# Patient Record
Sex: Female | Born: 1970 | Race: White | Hispanic: No | Marital: Married | Smoking: Former smoker
Health system: Southern US, Community
[De-identification: ages and names within clinical notes are randomized; demographics above are authoritative.]

## PROBLEM LIST (undated history)

## (undated) DIAGNOSIS — F419 Anxiety disorder, unspecified: Secondary | ICD-10-CM

## (undated) DIAGNOSIS — M1990 Unspecified osteoarthritis, unspecified site: Secondary | ICD-10-CM

## (undated) DIAGNOSIS — M419 Scoliosis, unspecified: Secondary | ICD-10-CM

## (undated) DIAGNOSIS — G43909 Migraine, unspecified, not intractable, without status migrainosus: Secondary | ICD-10-CM

## (undated) DIAGNOSIS — R000 Tachycardia, unspecified: Secondary | ICD-10-CM

## (undated) DIAGNOSIS — G8929 Other chronic pain: Secondary | ICD-10-CM

## (undated) DIAGNOSIS — K219 Gastro-esophageal reflux disease without esophagitis: Secondary | ICD-10-CM

## (undated) DIAGNOSIS — M549 Dorsalgia, unspecified: Secondary | ICD-10-CM

## (undated) DIAGNOSIS — R002 Palpitations: Secondary | ICD-10-CM

## (undated) HISTORY — PX: CHOLECYSTECTOMY: SHX55

## (undated) HISTORY — PX: BACK SURGERY: SHX140

---

## 2007-07-28 ENCOUNTER — Ambulatory Visit: Payer: Self-pay | Admitting: Internal Medicine

## 2008-08-04 ENCOUNTER — Inpatient Hospital Stay: Payer: Self-pay | Admitting: Psychiatry

## 2008-10-30 ENCOUNTER — Ambulatory Visit: Payer: Self-pay

## 2009-03-05 ENCOUNTER — Ambulatory Visit: Payer: Self-pay | Admitting: Internal Medicine

## 2009-03-16 ENCOUNTER — Ambulatory Visit: Payer: Self-pay | Admitting: Internal Medicine

## 2010-02-28 ENCOUNTER — Ambulatory Visit: Payer: Self-pay | Admitting: Internal Medicine

## 2010-11-11 ENCOUNTER — Ambulatory Visit: Payer: Self-pay | Admitting: Internal Medicine

## 2013-01-01 ENCOUNTER — Emergency Department: Payer: Self-pay | Admitting: Emergency Medicine

## 2013-01-01 LAB — URINALYSIS, COMPLETE
Bilirubin,UR: NEGATIVE
Nitrite: NEGATIVE
Ph: 7 (ref 4.5–8.0)
Protein: NEGATIVE
RBC,UR: 16 /HPF (ref 0–5)
Specific Gravity: 1.024 (ref 1.003–1.030)

## 2013-01-01 LAB — CBC
HGB: 12.6 g/dL (ref 12.0–16.0)
MCHC: 34.8 g/dL (ref 32.0–36.0)
Platelet: 252 10*3/uL (ref 150–440)
RDW: 13.3 % (ref 11.5–14.5)
WBC: 10.4 10*3/uL (ref 3.6–11.0)

## 2013-01-02 LAB — COMPREHENSIVE METABOLIC PANEL
Albumin: 3.2 g/dL — ABNORMAL LOW (ref 3.4–5.0)
Alkaline Phosphatase: 70 U/L (ref 50–136)
Anion Gap: 5 — ABNORMAL LOW (ref 7–16)
Bilirubin,Total: 0.3 mg/dL (ref 0.2–1.0)
Calcium, Total: 8.4 mg/dL — ABNORMAL LOW (ref 8.5–10.1)
Chloride: 108 mmol/L — ABNORMAL HIGH (ref 98–107)
EGFR (African American): >60
EGFR (Non-African Amer.): >60
Osmolality: 278 (ref 275–301)
Potassium: 3.6 mmol/L (ref 3.5–5.1)
SGOT(AST): 18 U/L (ref 15–37)
Sodium: 139 mmol/L (ref 136–145)
Total Protein: 6.9 g/dL (ref 6.4–8.2)

## 2013-02-10 ENCOUNTER — Ambulatory Visit: Payer: Self-pay | Admitting: Unknown Physician Specialty

## 2013-02-18 ENCOUNTER — Ambulatory Visit: Payer: Self-pay | Admitting: Surgery

## 2013-03-02 LAB — PATHOLOGY REPORT

## 2013-05-18 ENCOUNTER — Ambulatory Visit: Payer: Self-pay

## 2013-07-28 ENCOUNTER — Emergency Department (HOSPITAL_COMMUNITY)
Admission: EM | Admit: 2013-07-28 | Discharge: 2013-07-28 | Disposition: A | Discharge status: Home / Self Care | Payer: No Typology Code available for payment source | Attending: Emergency Medicine | Admitting: Emergency Medicine

## 2013-07-28 ENCOUNTER — Encounter (HOSPITAL_COMMUNITY): Payer: Self-pay | Admitting: Emergency Medicine

## 2013-07-28 DIAGNOSIS — Z3202 Encounter for pregnancy test, result negative: Secondary | ICD-10-CM | POA: Insufficient documentation

## 2013-07-28 DIAGNOSIS — R1013 Epigastric pain: Secondary | ICD-10-CM | POA: Insufficient documentation

## 2013-07-28 DIAGNOSIS — Z79899 Other long term (current) drug therapy: Secondary | ICD-10-CM | POA: Insufficient documentation

## 2013-07-28 DIAGNOSIS — Z87891 Personal history of nicotine dependence: Secondary | ICD-10-CM | POA: Insufficient documentation

## 2013-07-28 DIAGNOSIS — R110 Nausea: Secondary | ICD-10-CM | POA: Insufficient documentation

## 2013-07-28 DIAGNOSIS — Z9089 Acquired absence of other organs: Secondary | ICD-10-CM | POA: Insufficient documentation

## 2013-07-28 LAB — CBC WITH DIFFERENTIAL/PLATELET
BASOS ABS: 0 10*3/uL (ref 0.0–0.1)
Basophils Relative: 0 % (ref 0–1)
Eosinophils Absolute: 0.1 10*3/uL (ref 0.0–0.7)
Eosinophils Relative: 1 % (ref 0–5)
HEMATOCRIT: 40.2 % (ref 36.0–46.0)
HEMOGLOBIN: 13.6 g/dL (ref 12.0–15.0)
LYMPHS PCT: 22 % (ref 12–46)
Lymphs Abs: 2.1 10*3/uL (ref 0.7–4.0)
MCH: 29.2 pg (ref 26.0–34.0)
MCHC: 33.8 g/dL (ref 30.0–36.0)
MCV: 86.5 fL (ref 78.0–100.0)
MONO ABS: 0.8 10*3/uL (ref 0.1–1.0)
MONOS PCT: 8 % (ref 3–12)
NEUTROS ABS: 6.6 10*3/uL (ref 1.7–7.7)
NEUTROS PCT: 69 % (ref 43–77)
Platelets: 246 10*3/uL (ref 150–400)
RBC: 4.65 MIL/uL (ref 3.87–5.11)
RDW: 13 % (ref 11.5–15.5)
WBC: 9.6 10*3/uL (ref 4.0–10.5)

## 2013-07-28 LAB — URINALYSIS, ROUTINE W REFLEX MICROSCOPIC
Bilirubin Urine: NEGATIVE
GLUCOSE, UA: NEGATIVE mg/dL
KETONES UR: 15 mg/dL — AB
Nitrite: NEGATIVE
PROTEIN: NEGATIVE mg/dL
Specific Gravity, Urine: 1.026 (ref 1.005–1.030)
UROBILINOGEN UA: 0.2 mg/dL (ref 0.0–1.0)
pH: 5.5 (ref 5.0–8.0)

## 2013-07-28 LAB — POCT PREGNANCY, URINE: PREG TEST UR: NEGATIVE

## 2013-07-28 LAB — POCT I-STAT, CHEM 8
BUN: 15 mg/dL (ref 6–23)
Calcium, Ion: 1.25 mmol/L — ABNORMAL HIGH (ref 1.12–1.23)
Chloride: 104 mEq/L (ref 96–112)
Creatinine, Ser: 0.8 mg/dL (ref 0.50–1.10)
GLUCOSE: 83 mg/dL (ref 70–99)
HCT: 41 % (ref 36.0–46.0)
HEMOGLOBIN: 13.9 g/dL (ref 12.0–15.0)
Potassium: 3.9 mEq/L (ref 3.7–5.3)
Sodium: 142 mEq/L (ref 137–147)
TCO2: 25 mmol/L (ref 0–100)

## 2013-07-28 LAB — COMPREHENSIVE METABOLIC PANEL
ALK PHOS: 56 U/L (ref 39–117)
ALT: 18 U/L (ref 0–35)
AST: 17 U/L (ref 0–37)
Albumin: 3.4 g/dL — ABNORMAL LOW (ref 3.5–5.2)
BILIRUBIN TOTAL: 0.4 mg/dL (ref 0.3–1.2)
BUN: 15 mg/dL (ref 6–23)
CHLORIDE: 105 meq/L (ref 96–112)
CO2: 26 meq/L (ref 19–32)
CREATININE: 0.71 mg/dL (ref 0.50–1.10)
Calcium: 9.1 mg/dL (ref 8.4–10.5)
GFR calc Af Amer: >90 mL/min (ref >90)
Glucose, Bld: 86 mg/dL (ref 70–99)
POTASSIUM: 4.4 meq/L (ref 3.7–5.3)
Sodium: 142 mEq/L (ref 137–147)
Total Protein: 7.8 g/dL (ref 6.0–8.3)

## 2013-07-28 LAB — URINE MICROSCOPIC-ADD ON

## 2013-07-28 LAB — LIPASE, BLOOD: Lipase: 41 U/L (ref 11–59)

## 2013-07-28 MED ORDER — ESOMEPRAZOLE MAGNESIUM 40 MG PO CPDR: 40.0000 mg | DELAYED_RELEASE_CAPSULE | Freq: Every day | ORAL | Status: DC

## 2013-07-28 MED ORDER — SUCRALFATE 1 GM/10ML PO SUSP
1.0000 g | Freq: Three times a day (TID) | ORAL | Status: DC
Start: 2013-07-28 — End: 2023-12-01

## 2013-07-28 MED ORDER — GI COCKTAIL ~~LOC~~
30.0000 mL | Freq: Once | ORAL | Status: AC
  Administered 2013-07-28: 30 mL via ORAL
  Filled 2013-07-28: qty 30

## 2013-07-28 MED ORDER — OXYCODONE-ACETAMINOPHEN 5-325 MG PO TABS
1.0000 | ORAL_TABLET | Freq: Once | ORAL | Status: AC
Start: 2013-07-28 — End: 2013-07-28
  Administered 2013-07-28: 1 via ORAL
  Filled 2013-07-28 (×2): qty 1

## 2013-07-28 NOTE — ED Notes (Signed)
Pt c/o mid upper burning sharp abdominal pain onset Saturday. Pt reports pain causes nausea. Pt had Gallbladder removed in August and was suppose to do a follow up ultrasound, but she felt better.

## 2013-07-28 NOTE — Discharge Instructions (Signed)
Return to the ED with any concerns including vomiting, chest pain, difficulty breathing, worsening pain, decreased level of alertness/lethargy, or any other alarming symptoms

## 2013-07-28 NOTE — ED Notes (Signed)
Percocet dropped on floor by RN.  Percocet tablet wasted and witnessed by Gren[place]Brittany, Charity fundraiserN.  Second tablet removed from pyxis.

## 2013-07-28 NOTE — ED Provider Notes (Signed)
CSN: [id number]     Arrival date & time 07/28/13  1326 History   First MD Initiated Contact with Patient 07/28/13 1348     Chief Complaint  Patient presents with  . Abdominal Pain  . Nausea   (Consider location/radiation/quality/duration/timing/severity/associated sxs/prior Treatment) HPI Pt presents with c/o burning pain in epigastric region.  She states symptoms have been constant for the past several days.  She states she has hx of heartburn but usually feels it more in her chest.  Today she describes a burning sensation in her epigastrium.  No vomiting.  But with increased pain has had some nausea.  She has had cholecystectomy in august 2014, had some pain afterwards but this resolved- pain today is different.  No fever, no diarrhea.  Cannot relate pain to lying flat, certain foods.  Not made worse or better by eating or drinking.  Has not had alcohol in over 2 weeks.  No fever/chills.  No sob, no chest pain.  There are no other associated systemic symptoms, there are no other alleviating or modifying factors.   History reviewed. No pertinent past medical history. Past Surgical History  Procedure Laterality Date  . Cholecystectomy     No family history on file. History  Substance Use Topics  . Smoking status: Former Games developermoker  . Smokeless tobacco: Not on file  . Alcohol Use: Yes     Comment: rare   OB History   Grav Para Term Preterm Abortions TAB SAB Ect Mult Living                 Review of Systems ROS reviewed and all otherwise negative except for mentioned in HPI  Allergies  Review of patient's allergies indicates no known allergies.  Home Medications   Current Outpatient Rx  Name  Route  Sig  Dispense  Refill  . Ca Carbonate-Mag Hydroxide (ROLAIDS PO)   Oral   Take 1 tablet by mouth 3 (three) times daily as needed (heartburn).         . clonazePAM (KLONOPIN) 0.5 MG tablet   Oral   Take 0.5 mg by mouth 3 (three) times daily as needed for anxiety.         .  Norgestimate-Ethinyl Estradiol Triphasic (TRI-SPRINTEC) 0.18/0.215/0.25 MG-35 MCG tablet   Oral   Take 1 tablet by mouth daily.         . ranitidine (ZANTAC) 150 MG tablet   Oral   Take 150 mg by mouth 2 (two) times daily as needed for heartburn.         . esomeprazole (NEXIUM) 40 MG capsule   Oral   Take 1 capsule (40 mg total) by mouth daily.   30 capsule   0   . sucralfate (CARAFATE) 1 GM/10ML suspension   Oral   Take 10 mLs (1 g total) by mouth 4 (four) times daily -  with meals and at bedtime.   420 mL   0    BP 127/75  Pulse 77  Temp(Src) 98.5 F (36.9 C) (Oral)  Resp 18  Wt 128 lb 11.2 oz (58.378 kg)  SpO2 99%  LMP 07/07/2013 Vitals reviewed Physical Exam Physical Examination: General appearance - alert, well appearing, and in no distress Mental status - alert, oriented to person, place, and time Eyes - no scleral icterus, no conjunctival injection Mouth - mucous membranes moist, pharynx normal without lesions Chest - clear to auscultation, no wheezes, rales or rhonchi, symmetric air entry Heart - normal rate, regular  rhythm, normal S1, S2, no murmurs, rubs, clicks or gallops Abdomen - soft, mild ttp in epigastric region, no gaurding or rebound, nondistended, no masses or organomegaly Extremities - peripheral pulses normal, no pedal edema, no clubbing or cyanosis Skin - normal coloration and turgor, no rashes  ED Course  Procedures (including critical care time) Labs Review Labs Reviewed  COMPREHENSIVE METABOLIC PANEL - Abnormal; Notable for the following:    Albumin 3.4 (*)    All other components within normal limits  URINALYSIS, ROUTINE W REFLEX MICROSCOPIC - Abnormal; Notable for the following:    Color, Urine AMBER (*)    APPearance CLOUDY (*)    Hgb urine dipstick LARGE (*)    Ketones, ur 15 (*)    Leukocytes, UA MODERATE (*)    All other components within normal limits  URINE MICROSCOPIC-ADD ON - Abnormal; Notable for the following:     Squamous Epithelial / LPF FEW (*)    Bacteria, UA FEW (*)    All other components within normal limits  POCT I-STAT, CHEM 8 - Abnormal; Notable for the following:    Calcium, Ion 1.25 (*)    All other components within normal limits  URINE CULTURE  CBC WITH DIFFERENTIAL  LIPASE, BLOOD  POCT PREGNANCY, URINE   Imaging Review No results found.  EKG Interpretation    Date/Time:  Thursday July 28 2013 16:51:57 EST Ventricular Rate:  86 PR Interval:  143 QRS Duration: 99 QT Interval:  373 QTC Calculation: 446 R Axis:   56 Text Interpretation:  Sinus rhythm RSR' in V1 or V2, right VCD or RVH No old tracing to compare Confirmed by [facility]  MD, Delmer Kowalski [phone]) on 07/28/2013 7:02:14 PM Also confirmed by [facility] Ganja  MD, Morna Flud [phone]), editor Dan Humphreys, SANDRA [phone])  on 07/29/2013 7:30:19 AM            MDM   1. Epigastric pain    Epigastric pain, labs reassuring.  EKG reassuring as well.  Doubt ACS.  No findings c/w retained stone, pancreatitis, doubt SBO.  Pt treated with GI cocktail with some relief.  Long d/w patient and husband at bedside about results and differential.  Plan nexium and carafate trial. She will need to f/u with her PMD if symptoms persist.  May need endoscopy if symptoms persist.  Discharged with strict return precautions.  Pt agreeable with plan.    Ethelda Chick, MD 07/30/13 [phone]

## 2013-07-29 LAB — URINE CULTURE
Colony Count: NO GROWTH
Culture: NO GROWTH

## 2014-04-25 ENCOUNTER — Encounter: Payer: Self-pay | Admitting: Internal Medicine

## 2014-11-17 NOTE — Op Note (Signed)
PATIENT NAME:  Gabrielle Alexander, Gabrielle Alexander MR#:  [id number] DATE OF BIRTH:  Oct 09, 1970  DATE OF PROCEDURE:  02/18/2013  PREOPERATIVE DIAGNOSIS: Chronic cholecystitis.   POSTOPERATIVE DIAGNOSIS: Chronic cholecystitis.   PROCEDURE: Laparoscopic cholecystectomy, cholangiogram.   SURGEON: Renda RollsWilton Smith, M.D.   ANESTHESIA: General.   INDICATION: This 44 year old female has a history of right upper quadrant pains over the past 6 months. She had an ultrasound demonstrating particulate matter within the gallbladder and some irregularity of the mucosa, possibly polyps versus small stones. Surgery was recommended for definitive treatment.   DESCRIPTION OF PROCEDURE: The patient was placed on the operating table in the supine position under general endotracheal anesthesia. The abdomen was prepared with ChloraPrep, draped in a sterile manner.   A short incision was made in the inferior aspect of the umbilicus and carried down to the deep fascia, which was grasped with laryngeal hook and elevated. A Veress needle was inserted, aspirated and irrigated with a saline solution. Next, the peritoneal cavity was inflated with carbon dioxide. The Veress needle was removed. The 10 mm cannula was inserted. The 10 mm, 0 degree laparoscope was inserted to view the peritoneal cavity. The liver appeared to have some mild degree of fatty infiltration. Next, an incision was made in the epigastrium, slightly to the right of the midline, to introduce an 11 mm cannula. Two incisions were made in the lateral aspect of the right upper quadrant to introduce two 5 mm cannulas.   The patient was placed in the reverse Trendelenburg position and turned several degrees to the left. The gallbladder was retracted towards the right shoulder. A number of adhesions were taken down with blunt dissection. The infundibulum was retracted inferiorly and laterally. The porta hepatis was demonstrated. The neck of the gallbladder was mobilized with incision of  the visceral peritoneum. The cystic duct was dissected free from surrounding structures. The cystic artery was dissected free from surrounding structures. A critical view of safety was demonstrated. An endoclip was placed across the cystic duct adjacent to the neck of the gallbladder. An incision was made in the cystic duct to introduce a Reddick catheter. Half-strength Conray-60 dye was injected as the cholangiogram was done with fluoroscopy, demonstrating the biliary tree and prompt flow of dye into the duodenum. The cholangiogram appeared normal. The Reddick catheter was removed. The cystic duct was doubly ligated with endoclips and divided. The cystic artery was controlled with an endoclip and divided. The gallbladder was separated from the liver with hook and cautery. Bleeding was very scant. Hemostasis subsequently intact. The gallbladder was delivered up through the infraumbilical incision and submitted in formalin for routine pathology. The right upper quadrant was further inspected. Hemostasis appeared to be intact. The cannula was removed. Carbon dioxide allowed to escape from the peritoneal cavity. There was some oozing from multiple subcutaneous sites, which were cauterized. The wounds were infiltrated with 0.5% Sensorcaine with epinephrine. Hemostasis subsequently appeared to be intact. The wounds were closed with interrupted 5-0 chromic subcuticular suture, benzoin and Steri-Strips. Dressings were applied with paper tape. The patient tolerated surgery satisfactorily and was then prepared for transfer to the recovery room.    ____________________________ Shela CommonsJ. Renda RollsWilton Smith, MD jws:dmm D: 02/18/2013 12:25:02 ET T: 02/18/2013 12:49:15 ET JOB#: [id number]  cc: Adella HareJ. Wilton Smith, MD, <Dictator> Adella HareWILTON J SMITH MD ELECTRONICALLY SIGNED 02/19/2013 13:19

## 2015-03-23 DIAGNOSIS — G8929 Other chronic pain: Secondary | ICD-10-CM | POA: Insufficient documentation

## 2015-05-08 ENCOUNTER — Telehealth: Payer: Self-pay | Admitting: Pain Medicine

## 2015-05-08 NOTE — Telephone Encounter (Signed)
Patient states she never got a call to sched MRI appt. Is there any point in coming to appt that is sched before having MRI and if not she wants to get MRI set up asap/ we need order to be able to set up and prior auth MRI

## 2015-05-10 NOTE — Telephone Encounter (Signed)
Left voice mail with patient that she needs to schedule an appt with Dr Laban EmperorNaveira.  Is an established patient at [facility].    When patient returns call inform patient that this must have been scheduled through [facility] and we have no indication of any MRI appointment for her.

## 2015-05-10 NOTE — Telephone Encounter (Signed)
Please take care of this. Talk to me if you have any questions. 

## 2015-06-05 ENCOUNTER — Other Ambulatory Visit: Payer: Self-pay | Admitting: Infectious Diseases

## 2015-06-05 DIAGNOSIS — Z1231 Encounter for screening mammogram for malignant neoplasm of breast: Secondary | ICD-10-CM

## 2015-06-06 ENCOUNTER — Ambulatory Visit
Admission: RE | Admit: 2015-06-06 | Discharge: 2015-06-06 | Disposition: A | Discharge status: Home / Self Care | Payer: No Typology Code available for payment source | Source: Ambulatory Visit | Attending: Infectious Diseases | Admitting: Infectious Diseases

## 2015-06-06 ENCOUNTER — Other Ambulatory Visit: Payer: Self-pay | Admitting: Infectious Diseases

## 2015-06-06 DIAGNOSIS — Z1231 Encounter for screening mammogram for malignant neoplasm of breast: Secondary | ICD-10-CM | POA: Diagnosis not present

## 2015-06-26 ENCOUNTER — Emergency Department
Admission: EM | Admit: 2015-06-26 | Discharge: 2015-06-26 | Disposition: A | Discharge status: Home / Self Care | Payer: No Typology Code available for payment source | Attending: Emergency Medicine | Admitting: Emergency Medicine

## 2015-06-26 ENCOUNTER — Encounter: Payer: Self-pay | Admitting: Emergency Medicine

## 2015-06-26 ENCOUNTER — Emergency Department: Payer: No Typology Code available for payment source

## 2015-06-26 DIAGNOSIS — Z87891 Personal history of nicotine dependence: Secondary | ICD-10-CM | POA: Diagnosis not present

## 2015-06-26 DIAGNOSIS — Z79899 Other long term (current) drug therapy: Secondary | ICD-10-CM | POA: Diagnosis not present

## 2015-06-26 DIAGNOSIS — R002 Palpitations: Secondary | ICD-10-CM | POA: Insufficient documentation

## 2015-06-26 DIAGNOSIS — R079 Chest pain, unspecified: Secondary | ICD-10-CM | POA: Insufficient documentation

## 2015-06-26 DIAGNOSIS — R0602 Shortness of breath: Secondary | ICD-10-CM | POA: Diagnosis not present

## 2015-06-26 DIAGNOSIS — R42 Dizziness and giddiness: Secondary | ICD-10-CM | POA: Insufficient documentation

## 2015-06-26 DIAGNOSIS — Z3202 Encounter for pregnancy test, result negative: Secondary | ICD-10-CM | POA: Diagnosis not present

## 2015-06-26 HISTORY — DX: Scoliosis, unspecified: M41.9

## 2015-06-26 HISTORY — DX: Unspecified osteoarthritis, unspecified site: M19.90

## 2015-06-26 HISTORY — DX: Anxiety disorder, unspecified: F41.9

## 2015-06-26 LAB — CBC
HCT: 38.7 % (ref 35.0–47.0)
Hemoglobin: 12.7 g/dL (ref 12.0–16.0)
MCH: 28.5 pg (ref 26.0–34.0)
MCHC: 32.7 g/dL (ref 32.0–36.0)
MCV: 87 fL (ref 80.0–100.0)
PLATELETS: 217 10*3/uL (ref 150–440)
RBC: 4.45 MIL/uL (ref 3.80–5.20)
RDW: 13.1 % (ref 11.5–14.5)
WBC: 7.9 10*3/uL (ref 3.6–11.0)

## 2015-06-26 LAB — TSH: TSH: 1.05 u[IU]/mL (ref 0.350–4.500)

## 2015-06-26 LAB — URINALYSIS COMPLETE WITH MICROSCOPIC (ARMC ONLY)
BILIRUBIN URINE: NEGATIVE
Bacteria, UA: NONE SEEN
Glucose, UA: NEGATIVE mg/dL
KETONES UR: NEGATIVE mg/dL
Nitrite: NEGATIVE
PROTEIN: NEGATIVE mg/dL
Specific Gravity, Urine: 1.02 (ref 1.005–1.030)
pH: 5 (ref 5.0–8.0)

## 2015-06-26 LAB — BASIC METABOLIC PANEL
Anion gap: 3 — ABNORMAL LOW (ref 5–15)
BUN: 14 mg/dL (ref 6–20)
CO2: 28 mmol/L (ref 22–32)
Calcium: 8.5 mg/dL — ABNORMAL LOW (ref 8.9–10.3)
Chloride: 108 mmol/L (ref 101–111)
Creatinine, Ser: 0.62 mg/dL (ref 0.44–1.00)
GFR calc Af Amer: >60 mL/min (ref >60)
GLUCOSE: 103 mg/dL — AB (ref 65–99)
POTASSIUM: 3.5 mmol/L (ref 3.5–5.1)
Sodium: 139 mmol/L (ref 135–145)

## 2015-06-26 LAB — MAGNESIUM: MAGNESIUM: 1.8 mg/dL (ref 1.7–2.4)

## 2015-06-26 LAB — PREGNANCY, URINE: Preg Test, Ur: NEGATIVE

## 2015-06-26 LAB — TROPONIN I

## 2015-06-26 NOTE — ED Provider Notes (Signed)
[facility] Emergency Department Provider Note  ____________________________________________  Time seen: Approximately 7:59 PM  I have reviewed the triage vital signs and the nursing notes.   HISTORY  Chief Complaint No chief complaint on file.    HPI Gabrielle Alexander is a 44 y.o. female with a history of palpitations of unknown etiology presenting with palpitations. She states that over the last several weeks she's having intermittent palpitations. She sometimes gets them several times a day and symptoms goes for several days with no symptoms at all.However, over the last several days the patient reports that she has had increased frequency in her palpitations, as well as associated symptoms including shortness of breath, nausea without vomiting, diaphoresis. She has also felt lightheaded but has not had any syncopal episodes. She occasionally has some associated substernal chest pain that is nonradiating. She describes that the symptoms can come on at any time, and can last for several seconds. She denies any large caffeine consumption, tobacco abuse, cocaine or other illicit drug abuse, or any changes in her medications. No leg swelling or calf pain.   Past Medical History  Diagnosis Date  . Anxiety   . Arthritis   . Scoliosis     There are no active problems to display for this patient.   Past Surgical History  Procedure Laterality Date  . Cholecystectomy      Current Outpatient Rx  Name  Route  Sig  Dispense  Refill  . Ca Carbonate-Mag Hydroxide (ROLAIDS PO)   Oral   Take 1 tablet by mouth 3 (three) times daily as needed (heartburn).         . clonazePAM (KLONOPIN) 0.5 MG tablet   Oral   Take 0.5 mg by mouth 3 (three) times daily as needed for anxiety.         Marland Kitchen esomeprazole (NEXIUM) 40 MG capsule   Oral   Take 1 capsule (40 mg total) by mouth daily.   30 capsule   0   . Norgestimate-Ethinyl Estradiol Triphasic (TRI-SPRINTEC)  0.18/0.215/0.25 MG-35 MCG tablet   Oral   Take 1 tablet by mouth daily.         . ranitidine (ZANTAC) 150 MG tablet   Oral   Take 150 mg by mouth 2 (two) times daily as needed for heartburn.         . sucralfate (CARAFATE) 1 GM/10ML suspension   Oral   Take 10 mLs (1 g total) by mouth 4 (four) times daily -  with meals and at bedtime.   420 mL   0     Allergies Review of patient's allergies indicates no known allergies.  Family History  Problem Relation Age of Onset  . Breast cancer Mother 98    Social History Social History  Substance Use Topics  . Smoking status: Former Games developer  . Smokeless tobacco: None  . Alcohol Use: Yes     Comment: rare    Review of Systems Constitutional: No fever/chills. Positive lightheadedness. No syncope. Eyes: No visual changes. ENT: No sore throat. Cardiovascular: Positive chest pain, positive palpitations. Respiratory: Positive shortness of breath.  No cough. Gastrointestinal: No abdominal pain.  Positive nausea, no vomiting.  No diarrhea.  No constipation. Genitourinary: Negative for dysuria. Musculoskeletal: Negative for back pain. Skin: Negative for rash. Neurological: Negative for headaches, focal weakness or numbness.  10-point ROS otherwise negative.  ____________________________________________   PHYSICAL EXAM:  VITAL SIGNS: ED Triage Vitals  Enc Vitals Group     BP  06/26/15 1809 124/76 mmHg     Pulse Rate 06/26/15 1809 85     Resp 06/26/15 1809 18     Temp 06/26/15 1809 98.6 F (37 C)     Temp Source 06/26/15 1809 Oral     SpO2 06/26/15 1809 96 %     Weight 06/26/15 1809 121 lb (54.885 kg)     Height 06/26/15 1809  (1.499 m)     Head Cir --      Peak Flow --      Pain Score 06/26/15 1810 4     Pain Loc --      Pain Edu? --      Excl. in GC? --     Constitutional: Alert and oriented. Well appearing and in no acute distress. Answer question appropriately. Eyes: Conjunctivae are normal.   EOMI. Head: Atraumatic. Nose: No congestion/rhinnorhea. Mouth/Throat: Mucous membranes are moist.  Neck: No stridor.  Supple.  No JVD. Cardiovascular: Normal rate, regular rhythm. No murmurs, rubs or gallops.  Respiratory: Normal respiratory effort.  No retractions. Lungs CTAB.  No wheezes, rales or ronchi. Gastrointestinal: Soft and nontender. No distention. No peritoneal signs. Musculoskeletal: No LE edema. No calf tenderness, palpable cords or Homans sign. Neurologic:  Normal speech and language. No gross focal neurologic deficits are appreciated.  Skin:  Skin is warm, dry and intact. No rash noted. Psychiatric: Mood and affect are normal. Speech and behavior are normal.  Normal judgement.  ____________________________________________   LABS (all labs ordered are listed, but only abnormal results are displayed)  Labs Reviewed  BASIC METABOLIC PANEL - Abnormal; Notable for the following:    Glucose, Bld 103 (*)    Calcium 8.5 (*)    Anion gap 3 (*)    All other components within normal limits  URINALYSIS COMPLETEWITH MICROSCOPIC ([facility] ONLY) - Abnormal; Notable for the following:    Color, Urine YELLOW (*)    APPearance CLEAR (*)    Hgb urine dipstick 2+ (*)    Leukocytes, UA TRACE (*)    Squamous Epithelial / LPF 0-5 (*)    All other components within normal limits  CBC  TROPONIN I  MAGNESIUM  TSH  PREGNANCY, URINE   ____________________________________________  EKG  ED ECG REPORT I, Rockne Menghini, the attending physician, personally viewed and interpreted this ECG.   Date: 06/26/2015  EKG Time: 2008  Rate: 76  Rhythm: normal sinus rhythm  Axis: Normal  Intervals:none  ST&T Change: Nonspecific T-wave inversion in V1. No evidence of arrhythmia or PVCs. No ischemic changes.  ____________________________________________  RADIOLOGY  Dg Chest 2 View  06/26/2015  CLINICAL DATA:  44 year old female with history of heart palpitations and left-sided chest  pain. Difficulty catching breath. EXAM: CHEST  2 VIEW COMPARISON:  No priors. FINDINGS: Mild diffuse peribronchial cuffing. Lung volumes are normal. No consolidative airspace disease. No pleural effusions. No pneumothorax. No pulmonary nodule or mass noted. Pulmonary vasculature and the cardiomediastinal silhouette are within normal limits. IMPRESSION: 1. Mild diffuse peribronchial cuffing, suggestive of mild acute bronchitis. Electronically Signed   By: Trudie Reed M.D.   On: 06/26/2015 18:45    ____________________________________________   PROCEDURES  Procedure(s) performed: None  Critical Care performed: No ____________________________________________   INITIAL IMPRESSION / ASSESSMENT AND PLAN / ED COURSE  Pertinent labs & imaging results that were available during my care of the patient were reviewed by me and considered in my medical decision making (see chart for details).  44 y.o. female, otherwise healthy,  presenting with palpitations. The patient has had symptoms on and off for several weeks but are worse in the last several days. Given her lack of risk factors unlikely that this is a symptom related to ACS or MI. Consider intermittent arrhythmia. Consider electrolyte abnormality. I will also check her thyroid. If her workup in the emergency department is reassuring, I'll plan to call cardiology to have her set up for an outpatient Holter or event monitor.  ----------------------------------------- 8:17 PM on 06/26/2015 -----------------------------------------  The patient's chest x-ray shows some mild peribronchial coughing but the patient has no clinical symptoms that are consistent with bronchitis.  ____________________________________________  FINAL CLINICAL IMPRESSION(S) / ED DIAGNOSES  Final diagnoses:  Palpitations  Chest pain, unspecified chest pain type  Shortness of breath      NEW MEDICATIONS STARTED DURING THIS VISIT:  New Prescriptions   No  medications on file     Rockne MenghiniAnne-Caroline Corrie Reder, MD 06/26/15 2207

## 2015-06-26 NOTE — ED Notes (Signed)
Pt in via triage w/ complaints of heart "flutters", tightness in chest, body aches, shortness of breath x 2 days.  Pt A/Ox4, vitals WDL, no immediate distress.

## 2015-06-26 NOTE — Discharge Instructions (Signed)
Please return to the emergency department if you develop chest pain, shortness of breath, palpitations, lightheadedness or fainting, or any other symptoms concerning to you.  Please make an appointment with Dr. Othelia PullingKann's office to have a Holter monitor or an event monitor placed for further evaluation of your palpitations.

## 2015-06-26 NOTE — ED Notes (Signed)
Presents to ED with complaints of heart palpitations, pressure in chest, difficulty breathing, body aches, nausea x 48 hours.  [facility]  States she has been seen by cardiology for palpations in the past.  All work ups negative.

## 2015-06-29 ENCOUNTER — Other Ambulatory Visit: Payer: Self-pay | Admitting: Physician Assistant

## 2015-06-29 DIAGNOSIS — M542 Cervicalgia: Secondary | ICD-10-CM

## 2015-06-29 DIAGNOSIS — M47812 Spondylosis without myelopathy or radiculopathy, cervical region: Secondary | ICD-10-CM

## 2015-07-20 ENCOUNTER — Ambulatory Visit
Admission: RE | Admit: 2015-07-20 | Discharge: 2015-07-20 | Disposition: A | Discharge status: Home / Self Care | Payer: No Typology Code available for payment source | Source: Ambulatory Visit | Attending: Physician Assistant | Admitting: Physician Assistant

## 2015-07-20 DIAGNOSIS — M25512 Pain in left shoulder: Secondary | ICD-10-CM | POA: Diagnosis not present

## 2015-07-20 DIAGNOSIS — M50222 Other cervical disc displacement at C5-C6 level: Secondary | ICD-10-CM | POA: Insufficient documentation

## 2015-07-20 DIAGNOSIS — M542 Cervicalgia: Secondary | ICD-10-CM | POA: Insufficient documentation

## 2015-07-20 DIAGNOSIS — M50223 Other cervical disc displacement at C6-C7 level: Secondary | ICD-10-CM | POA: Insufficient documentation

## 2015-07-20 DIAGNOSIS — M4802 Spinal stenosis, cervical region: Secondary | ICD-10-CM | POA: Diagnosis not present

## 2015-07-20 DIAGNOSIS — M5030 Other cervical disc degeneration, unspecified cervical region: Secondary | ICD-10-CM | POA: Diagnosis not present

## 2015-07-20 DIAGNOSIS — M50221 Other cervical disc displacement at C4-C5 level: Secondary | ICD-10-CM | POA: Diagnosis not present

## 2015-07-20 DIAGNOSIS — M25511 Pain in right shoulder: Secondary | ICD-10-CM | POA: Insufficient documentation

## 2015-07-20 DIAGNOSIS — M47812 Spondylosis without myelopathy or radiculopathy, cervical region: Secondary | ICD-10-CM

## 2015-10-16 ENCOUNTER — Other Ambulatory Visit: Payer: Self-pay | Admitting: Nurse Practitioner

## 2015-10-16 DIAGNOSIS — R1084 Generalized abdominal pain: Secondary | ICD-10-CM

## 2015-10-18 ENCOUNTER — Ambulatory Visit
Admission: RE | Admit: 2015-10-18 | Discharge: 2015-10-18 | Disposition: A | Discharge status: Home / Self Care | Payer: No Typology Code available for payment source | Source: Ambulatory Visit | Attending: Nurse Practitioner | Admitting: Nurse Practitioner

## 2015-10-18 DIAGNOSIS — N838 Other noninflammatory disorders of ovary, fallopian tube and broad ligament: Secondary | ICD-10-CM | POA: Insufficient documentation

## 2015-10-18 DIAGNOSIS — K5641 Fecal impaction: Secondary | ICD-10-CM | POA: Diagnosis not present

## 2015-10-18 DIAGNOSIS — R1084 Generalized abdominal pain: Secondary | ICD-10-CM

## 2015-10-18 MED ORDER — IOPAMIDOL (ISOVUE-300) INJECTION 61%
85.0000 mL | Freq: Once | INTRAVENOUS | Status: AC | PRN
  Administered 2015-10-18: 85 mL via INTRAVENOUS

## 2015-10-29 DIAGNOSIS — M47817 Spondylosis without myelopathy or radiculopathy, lumbosacral region: Secondary | ICD-10-CM | POA: Insufficient documentation

## 2016-04-29 DIAGNOSIS — I471 Supraventricular tachycardia, unspecified: Secondary | ICD-10-CM | POA: Insufficient documentation

## 2016-05-21 ENCOUNTER — Emergency Department
Admission: EM | Admit: 2016-05-21 | Discharge: 2016-05-21 | Disposition: A | Discharge status: Home / Self Care | Payer: No Typology Code available for payment source | Attending: Emergency Medicine | Admitting: Emergency Medicine

## 2016-05-21 ENCOUNTER — Encounter: Payer: Self-pay | Admitting: Emergency Medicine

## 2016-05-21 ENCOUNTER — Emergency Department: Payer: No Typology Code available for payment source

## 2016-05-21 DIAGNOSIS — R51 Headache: Secondary | ICD-10-CM | POA: Diagnosis not present

## 2016-05-21 DIAGNOSIS — R531 Weakness: Secondary | ICD-10-CM | POA: Insufficient documentation

## 2016-05-21 DIAGNOSIS — R197 Diarrhea, unspecified: Secondary | ICD-10-CM | POA: Insufficient documentation

## 2016-05-21 DIAGNOSIS — R5383 Other fatigue: Secondary | ICD-10-CM | POA: Diagnosis not present

## 2016-05-21 DIAGNOSIS — R002 Palpitations: Secondary | ICD-10-CM | POA: Diagnosis not present

## 2016-05-21 DIAGNOSIS — Z79899 Other long term (current) drug therapy: Secondary | ICD-10-CM | POA: Diagnosis not present

## 2016-05-21 DIAGNOSIS — Z87891 Personal history of nicotine dependence: Secondary | ICD-10-CM | POA: Diagnosis not present

## 2016-05-21 DIAGNOSIS — R519 Headache, unspecified: Secondary | ICD-10-CM

## 2016-05-21 DIAGNOSIS — R112 Nausea with vomiting, unspecified: Secondary | ICD-10-CM | POA: Diagnosis not present

## 2016-05-21 HISTORY — DX: Palpitations: R00.2

## 2016-05-21 HISTORY — DX: Gastro-esophageal reflux disease without esophagitis: K21.9

## 2016-05-21 HISTORY — DX: Dorsalgia, unspecified: M54.9

## 2016-05-21 HISTORY — DX: Other chronic pain: G89.29

## 2016-05-21 HISTORY — DX: Tachycardia, unspecified: R00.0

## 2016-05-21 HISTORY — DX: Migraine, unspecified, not intractable, without status migrainosus: G43.909

## 2016-05-21 LAB — BASIC METABOLIC PANEL
Anion gap: 7 (ref 5–15)
BUN: 14 mg/dL (ref 6–20)
CO2: 24 mmol/L (ref 22–32)
CREATININE: 0.7 mg/dL (ref 0.44–1.00)
Calcium: 8.6 mg/dL — ABNORMAL LOW (ref 8.9–10.3)
Chloride: 106 mmol/L (ref 101–111)
GFR calc Af Amer: >60 mL/min (ref >60)
GLUCOSE: 96 mg/dL (ref 65–99)
Potassium: 3.5 mmol/L (ref 3.5–5.1)
SODIUM: 137 mmol/L (ref 135–145)

## 2016-05-21 LAB — CBC
HEMATOCRIT: 41.6 % (ref 35.0–47.0)
Hemoglobin: 13.9 g/dL (ref 12.0–16.0)
MCH: 28.6 pg (ref 26.0–34.0)
MCHC: 33.4 g/dL (ref 32.0–36.0)
MCV: 85.7 fL (ref 80.0–100.0)
PLATELETS: 280 10*3/uL (ref 150–440)
RBC: 4.86 MIL/uL (ref 3.80–5.20)
RDW: 12.9 % (ref 11.5–14.5)
WBC: 9 10*3/uL (ref 3.6–11.0)

## 2016-05-21 LAB — TROPONIN I: Troponin I: <0.03 ng/mL (ref <0.03)

## 2016-05-21 MED ORDER — ONDANSETRON 4 MG PO TBDP: ORAL_TABLET | ORAL | 0 refills | Status: DC

## 2016-05-21 MED ORDER — SODIUM CHLORIDE 0.9 % IV BOLUS (SEPSIS)
500.0000 mL | INTRAVENOUS | Status: AC
  Administered 2016-05-21: 500 mL via INTRAVENOUS

## 2016-05-21 MED ORDER — METOCLOPRAMIDE HCL 5 MG/ML IJ SOLN
10.0000 mg | INTRAMUSCULAR | Status: AC
  Administered 2016-05-21: 10 mg via INTRAVENOUS
  Filled 2016-05-21: qty 2

## 2016-05-21 MED ORDER — KETOROLAC TROMETHAMINE 30 MG/ML IJ SOLN
15.0000 mg | Freq: Once | INTRAMUSCULAR | Status: AC
  Administered 2016-05-21: 15 mg via INTRAVENOUS
  Filled 2016-05-21: qty 1

## 2016-05-21 MED ORDER — DIPHENHYDRAMINE HCL 50 MG/ML IJ SOLN
12.5000 mg | Freq: Once | INTRAMUSCULAR | Status: AC
  Administered 2016-05-21: 12.5 mg via INTRAVENOUS
  Filled 2016-05-21: qty 1

## 2016-05-21 NOTE — Discharge Instructions (Signed)
As we discussed, your workup today was reassuring.  Though we do not know exactly what is causing your symptoms, it appears that you have no emergent medical condition at this time and that you are safe to go home and follow up as recommended in this paperwork. ° °Please return immediately to the Emergency Department if you develop any new or worsening symptoms that concern you. ° °

## 2016-05-21 NOTE — ED Triage Notes (Signed)
Pt presents to ED via wheelchair with c/o chest discomfort, headache, nausea and diarrhea began about 5 am this morning. Pt states "I have had a history of heart palpitations." Pt reports when pt woke up this morning she was sweaty with chills. Pt denies vomiting, urinary symptoms, vision problems. Pt alert and oriented x 4, no increased work in breathing.

## 2016-05-21 NOTE — ED Provider Notes (Signed)
[facility] Emergency Department Provider Note  ____________________________________________   None    (approximate)  I have reviewed the triage vital signs and the nursing notes.   HISTORY  Chief Complaint Chest Pain    HPI Gabrielle Alexander is a 45 y.o. female with a history of heart palpitations, scoliosis status post back surgeries, chronic pain followed at the [facility] pain [facility], andanxiety who presents for evaluation of a constellation of symptoms.  She reports that she awoke at 5:00 in the morning drenched in sweat.  She has had multiple episodes of nausea and diarrhea as well as a couple of episodes of vomiting throughout the day.  She states that she feels heart palpitations every day but that they were slightly worse today.  She feels generalized fatigue and generalized weakness.  She also has a severe generalized headache; she does have headaches or migraines at times but this one is more severe than usual.  It was gradual in onset and nothing makes it better or worse.  Overall she describes her symptoms as severe.  She has been extensively evaluated for palpitations in the past by 2 different cardiologist, Dr. Welton Flakes and Dr. Gwen Pounds.  Worn a Holter monitor and she states that they were not able to tell her what is wrong.  She also sees a GI doctor for acid reflux and frequent stomach upset.  She goes to the [facility] pain clinic for chronic back pain and had some recent back surgeries.  Her Sampson Goon is her primary care doctor.  Past Medical History:  Diagnosis Date  . Acid reflux   . Anxiety   . Arthritis   . Chronic back pain greater than 3 months duration   . Heart palpitations   . Migraines   . Scoliosis   . Tachycardia     There are no active problems to display for this patient.   Past Surgical History:  Procedure Laterality Date  . BACK SURGERY    . CHOLECYSTECTOMY      Prior to Admission medications   Medication Sig Start Date End Date  Taking? Authorizing Provider  Ca Carbonate-Mag Hydroxide (ROLAIDS PO) Take 1 tablet by mouth 3 (three) times daily as needed (heartburn).    Historical Provider, MD  clonazePAM (KLONOPIN) 0.5 MG tablet Take 0.5 mg by mouth 3 (three) times daily as needed for anxiety.    Historical Provider, MD  esomeprazole (NEXIUM) 40 MG capsule Take 1 capsule (40 mg total) by mouth daily. 07/28/13   Jerelyn Scott, MD  Norgestimate-Ethinyl Estradiol Triphasic (TRI-SPRINTEC) 0.18/0.215/0.25 MG-35 MCG tablet Take 1 tablet by mouth daily.    Historical Provider, MD  ondansetron (ZOFRAN ODT) 4 MG disintegrating tablet Allow 1-2 tablets to dissolve in your mouth every 8 hours as needed for nausea/vomiting 05/21/16   Loleta Rose, MD  ranitidine (ZANTAC) 150 MG tablet Take 150 mg by mouth 2 (two) times daily as needed for heartburn.    Historical Provider, MD  sucralfate (CARAFATE) 1 GM/10ML suspension Take 10 mLs (1 g total) by mouth 4 (four) times daily -  with meals and at bedtime. 07/28/13   Jerelyn Scott, MD    Allergies Review of patient's allergies indicates no known allergies.  Family History  Problem Relation Age of Onset  . Breast cancer Mother 38    Social History Social History  Substance Use Topics  . Smoking status: Former Games developer  . Smokeless tobacco: Never Used  . Alcohol use Yes     Comment: rare  Review of Systems Constitutional: No fever/chills Eyes: No visual changes. ENT: No sore throat. Cardiovascular: Denies chest pain. Respiratory: Denies shortness of breath. Gastrointestinal: No abdominal pain.  +N/V/D Genitourinary: Negative for dysuria. Musculoskeletal: Negative for back pain. Skin: Negative for rash. Neurological: Generalized headache/migraine  10-point ROS otherwise negative.  ____________________________________________   PHYSICAL EXAM:  VITAL SIGNS: ED Triage Vitals [05/21/16 1910]  Enc Vitals Group     BP 122/89     Pulse Rate 65     Resp 18     Temp 98.4 F  (36.9 C)     Temp Source Oral     SpO2 96 %     Weight 133 lb (60.3 kg)     Height 4\' 11"  (1.499 m)     Head Circumference      Peak Flow      Pain Score 3     Pain Loc      Pain Edu?      Excl. in GC?     Constitutional: Alert and oriented. Well appearing and in no acute distress.  Appears tired. Eyes: Conjunctivae are normal. PERRL. EOMI. Head: Atraumatic. Nose: No congestion/rhinnorhea. Mouth/Throat: Mucous membranes are moist.  Oropharynx non-erythematous. Neck: No stridor.  No meningeal signs.   Cardiovascular: Normal rate, regular rhythm. Good peripheral circulation. Grossly normal heart sounds. Respiratory: Normal respiratory effort.  No retractions. Lungs CTAB. Gastrointestinal: Soft and nontender. No distention.  Musculoskeletal: No lower extremity tenderness nor edema. No gross deformities of extremities. Neurologic:  Normal speech and language. No gross focal neurologic deficits are appreciated.  Skin:  Skin is warm, dry and intact. No rash noted. Psychiatric: Mood and affect are flat and somewhat depressed. Speech and behavior are normal.  ____________________________________________   LABS (all labs ordered are listed, but only abnormal results are displayed)  Labs Reviewed  BASIC METABOLIC PANEL - Abnormal; Notable for the following:       Result Value   Calcium 8.6 (*)    All other components within normal limits  CBC  TROPONIN I   ____________________________________________  EKG  ED ECG REPORT I, Airrion Otting, the attending physician, personally viewed and interpreted this ECG.  Date: 05/21/2016 EKG Time: 19:14 Rate: 68 Rhythm: normal sinus rhythm QRS Axis: normal Intervals: normal ST/T Wave abnormalities: normal Conduction Disturbances: none Narrative Interpretation: unremarkable  ____________________________________________  RADIOLOGY   Dg Chest 2 View  Result Date: 05/21/2016 CLINICAL DATA:  Chest discomfort EXAM: CHEST  2 VIEW  COMPARISON:  06/26/2015 FINDINGS: The heart size and mediastinal contours are within normal limits. Both lungs are clear. The visualized skeletal structures are unremarkable. IMPRESSION: No active cardiopulmonary disease. Electronically Signed   By: Alcide Clever M.D.   On: 05/21/2016 19:44    ____________________________________________   PROCEDURES  Procedure(s) performed:   Procedures   Critical Care performed: No ____________________________________________   INITIAL IMPRESSION / ASSESSMENT AND PLAN / ED COURSE  Pertinent labs & imaging results that were available during my care of the patient were reviewed by me and considered in my medical decision making (see chart for details).  The patient is well-appearing and in no acute distress with stable vital signs, a normal EKG, and completely normal labs.  I had an extensive conversation with her and her husband about how she has no sign of acute or emergent medical condition at this time and her symptoms are more suggestive of a viral gastroenteritis that is making her feel worse than usual.  There is no evidence of  any arrhythmias at this time and no suggestion of any cardiac cause of her symptoms.  She mentioned how her chronic pain is an issue along with her palpitations and asked how she can manage her chronic pain along with her cardiac issues, and I explained that these long-term issues are best managed by her specialist including the chronic pain clinic and that I would not want to make any modifications to her medication regimens at this time.  She brought up her headache and it does sound somewhat like a migraine and we agreed that we would treat it as a migraine today and that it will make her sleepy and likely more comfortable for her to go home and get some good rest to follow-up with her regular doctors tomorrow.  We will proceed with that plan.    ____________________________________________  FINAL CLINICAL IMPRESSION(S) /  ED DIAGNOSES  Final diagnoses:  Nonintractable episodic headache, unspecified headache type  Heart palpitations     MEDICATIONS GIVEN DURING THIS VISIT:  Medications  ketorolac (TORADOL) 30 MG/ML injection 15 mg (15 mg Intravenous Given 05/21/16 2105)  metoCLOPramide (REGLAN) injection 10 mg (10 mg Intravenous Given 05/21/16 2104)  diphenhydrAMINE (BENADRYL) injection 12.5 mg (12.5 mg Intravenous Given 05/21/16 2104)  sodium chloride 0.9 % bolus 500 mL (0 mLs Intravenous Stopped 05/21/16 2151)     NEW OUTPATIENT MEDICATIONS STARTED DURING THIS VISIT:  Discharge Medication List as of 05/21/2016  9:35 PM    START taking these medications   Details  ondansetron (ZOFRAN ODT) 4 MG disintegrating tablet Allow 1-2 tablets to dissolve in your mouth every 8 hours as needed for nausea/vomiting, Print        Discharge Medication List as of 05/21/2016  9:35 PM      Discharge Medication List as of 05/21/2016  9:35 PM       Note:  This document was prepared using Dragon voice recognition software and may include unintentional dictation errors.    Loleta Roseory Lawsen Arnott, MD 05/21/16 2222

## 2016-05-27 ENCOUNTER — Other Ambulatory Visit: Payer: Self-pay | Admitting: Infectious Diseases

## 2016-05-27 DIAGNOSIS — Z1231 Encounter for screening mammogram for malignant neoplasm of breast: Secondary | ICD-10-CM

## 2016-07-01 ENCOUNTER — Ambulatory Visit: Payer: No Typology Code available for payment source

## 2016-07-14 ENCOUNTER — Ambulatory Visit
Admission: RE | Admit: 2016-07-14 | Discharge: 2016-07-14 | Disposition: A | Discharge status: Home / Self Care | Payer: No Typology Code available for payment source | Source: Ambulatory Visit | Attending: Infectious Diseases | Admitting: Infectious Diseases

## 2016-07-14 DIAGNOSIS — Z1231 Encounter for screening mammogram for malignant neoplasm of breast: Secondary | ICD-10-CM | POA: Diagnosis present

## 2016-07-14 DIAGNOSIS — R928 Other abnormal and inconclusive findings on diagnostic imaging of breast: Secondary | ICD-10-CM | POA: Insufficient documentation

## 2016-07-17 ENCOUNTER — Other Ambulatory Visit: Payer: Self-pay | Admitting: Infectious Diseases

## 2016-07-17 DIAGNOSIS — R928 Other abnormal and inconclusive findings on diagnostic imaging of breast: Secondary | ICD-10-CM

## 2016-07-17 DIAGNOSIS — N6489 Other specified disorders of breast: Secondary | ICD-10-CM

## 2016-07-24 IMAGING — MR MR CERVICAL SPINE W/O CM
5 series · 35 of 48 positions shown · non-contrast
Comparison: None.

CLINICAL DATA: Acute neck pain. Bilateral shoulder pain for 7
years.

EXAM:
MRI CERVICAL SPINE WITHOUT CONTRAST
TECHNIQUE: Multiplanar, multisequence MR imaging of the cervical spine was
performed. No intravenous contrast was administered.

[Series 2: T2 · sagittal · 3.0mm · 0.70mm/px · 7 of 15 slices shown (1 of 2)]
[im 1/15]
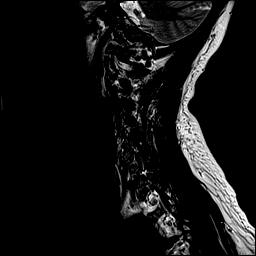
[im 3/15]
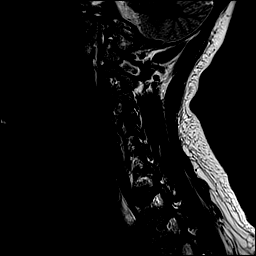
[im 5/15]
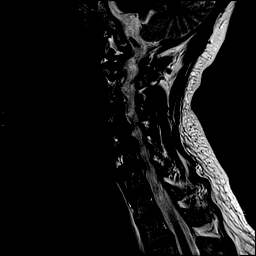
[im 8/15]
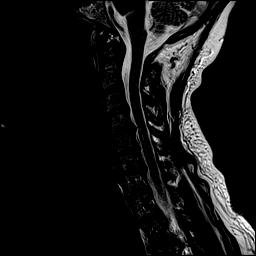
[im 10/15]
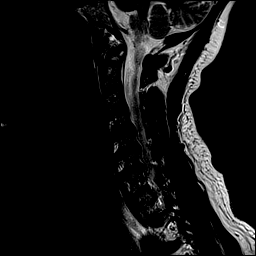
[im 12/15]
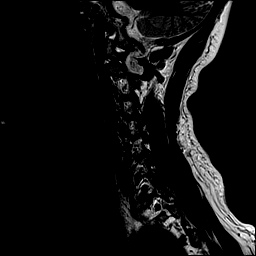
[im 15/15]
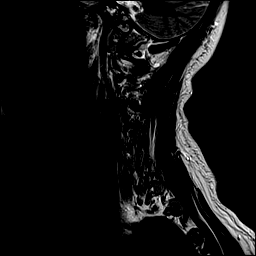

[Series 3: T1 · sagittal · 3.0mm · 0.70mm/px · 7 of 15 slices shown]
[im 1/15]
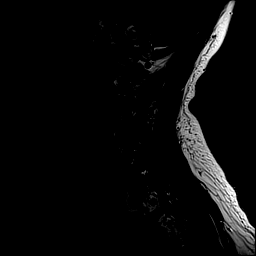
[im 3/15]
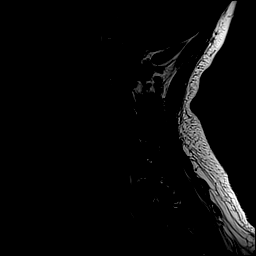
[im 5/15]
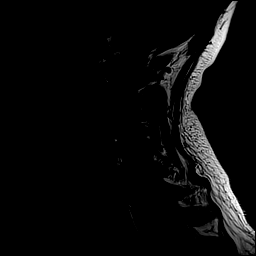
[im 8/15]
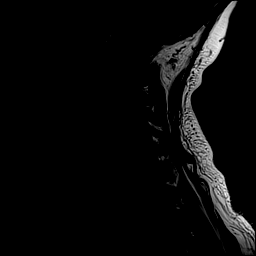
[im 10/15]
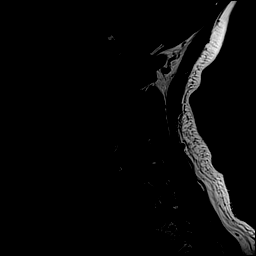
[im 12/15]
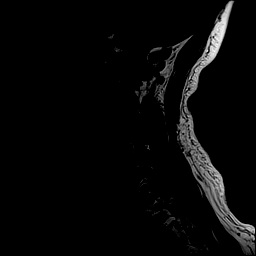
[im 15/15]
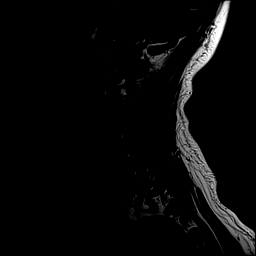

[Series 4: STIR · sagittal · 3.0mm · 0.35mm/px · 8 of 15 slices shown]
[im 1/15]
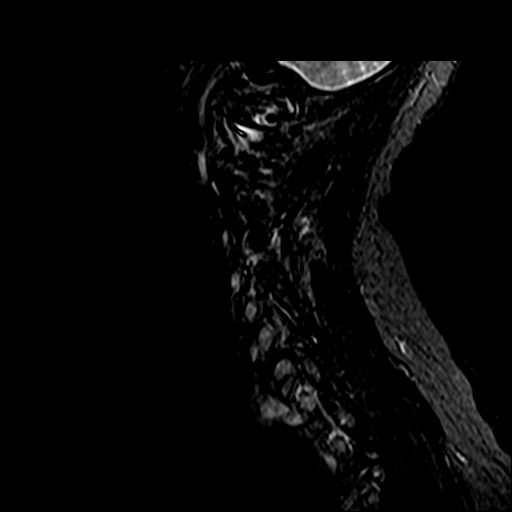
[im 3/15]
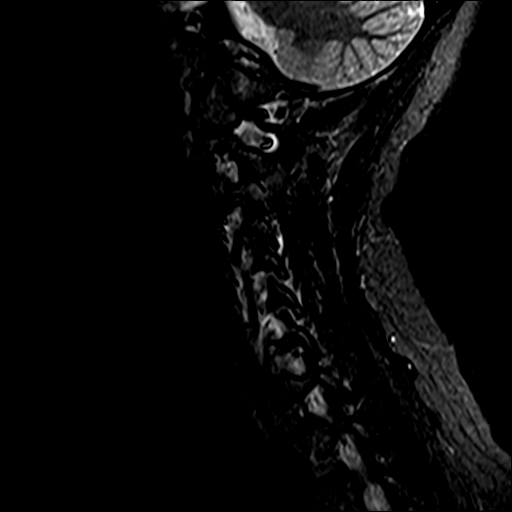
[im 5/15]
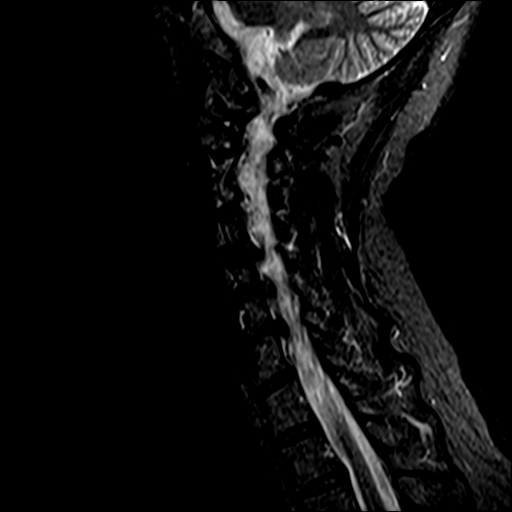
[im 7/15]
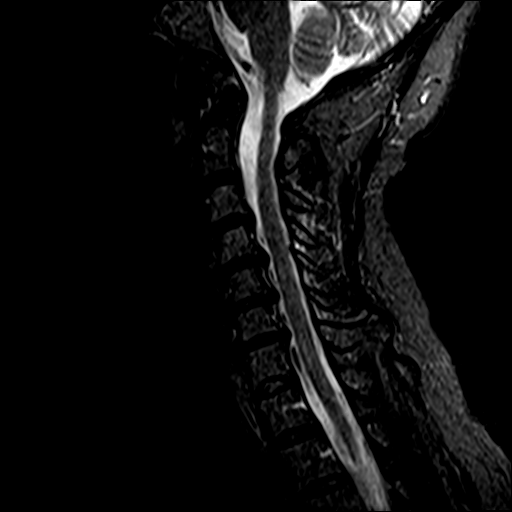
[im 9/15]
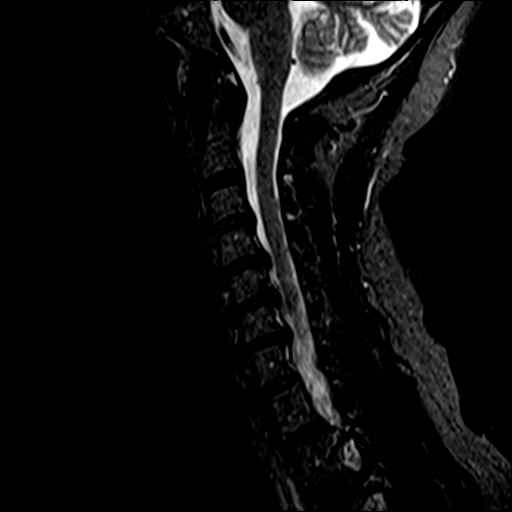
[im 11/15]
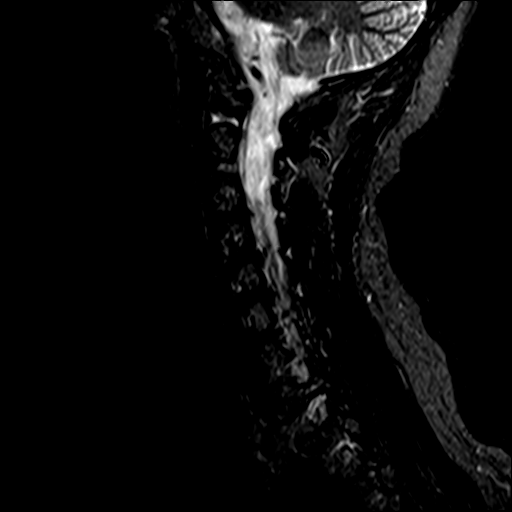
[im 13/15]
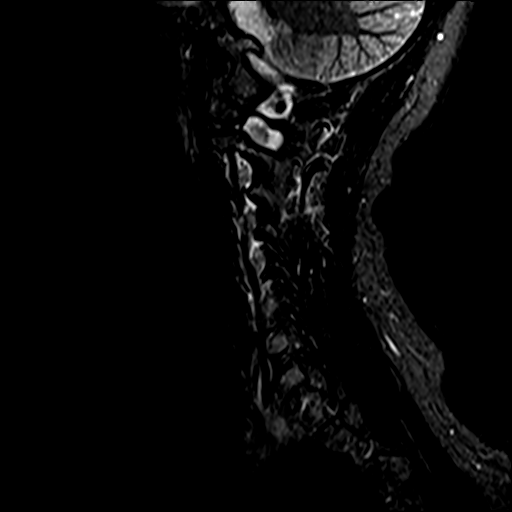
[im 15/15]
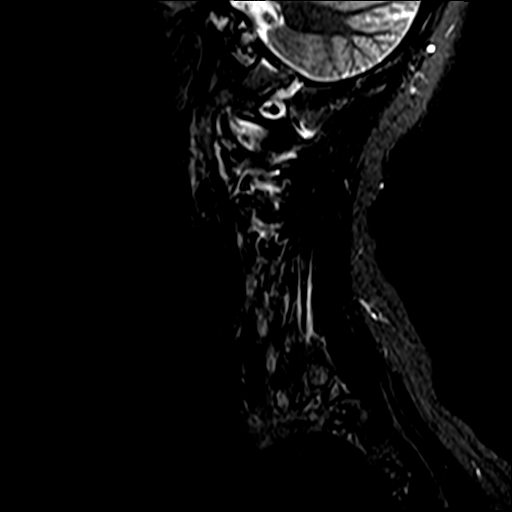

[Series 5: T2 · axial · 3.0mm · 0.70mm/px · z∈[-81,+9]mm · 9 of 25 slices shown (2 of 2)]
[im 1/25]
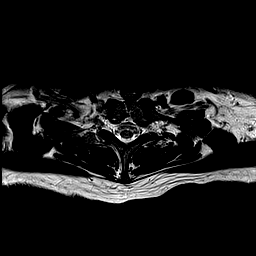
[im 5/25]
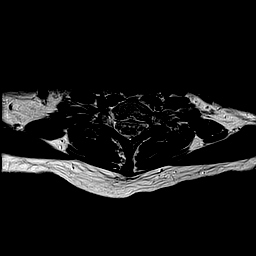
[im 9/25]
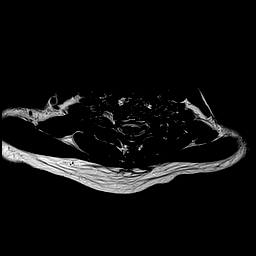
[im 11/25]
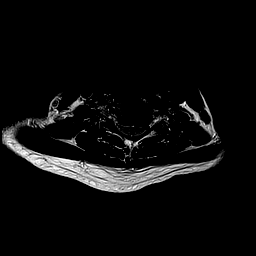
[im 13/25]
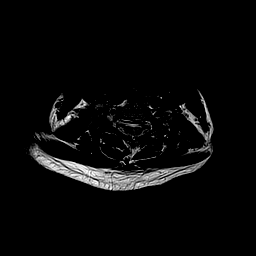
[im 15/25]
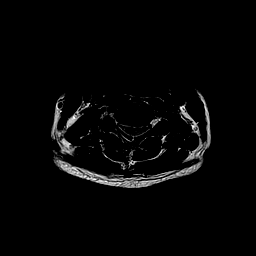
[im 17/25]
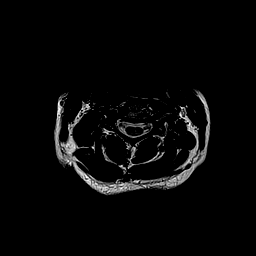
[im 21/25]
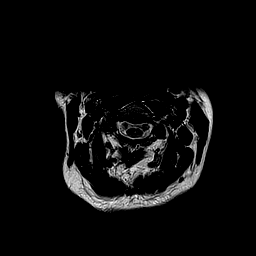
[im 25/25]
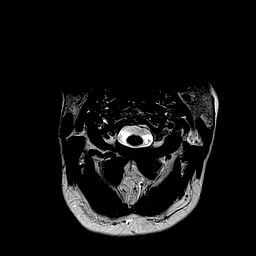

[Series 6: mpgr ax · axial · 3.0mm · 0.35mm/px · z∈[-81,-43]mm · 4 of 25 slices shown]
[im 1/25]
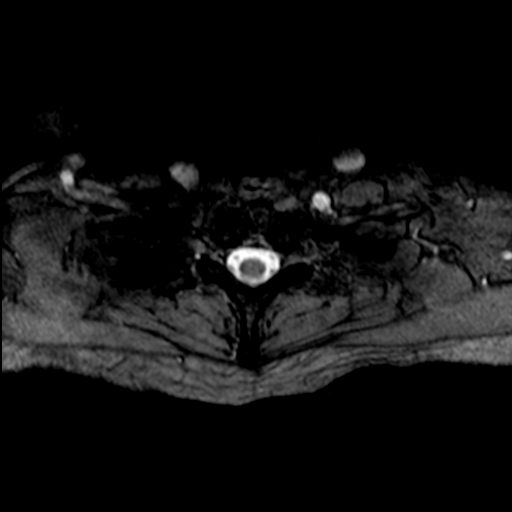
[im 5/25]
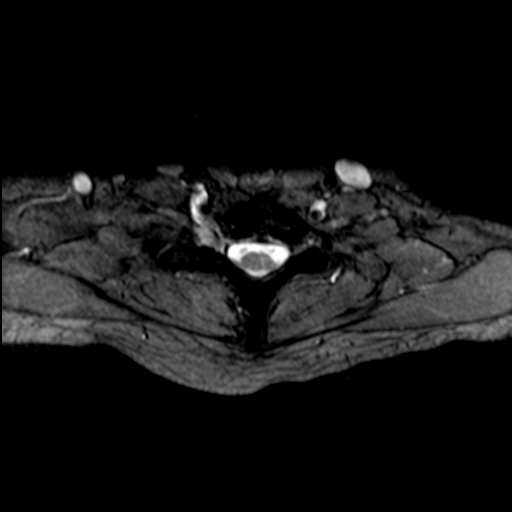
[im 9/25]
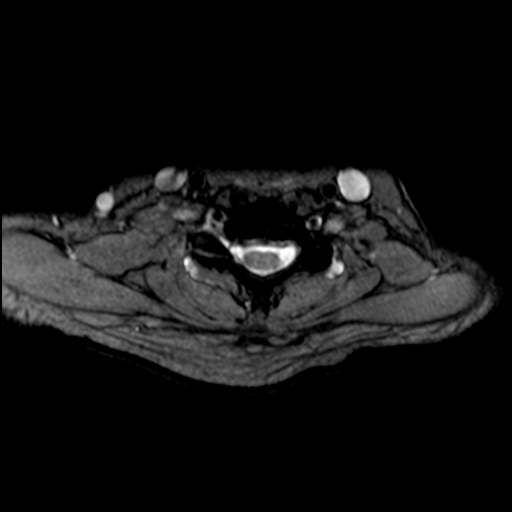
[im 11/25]
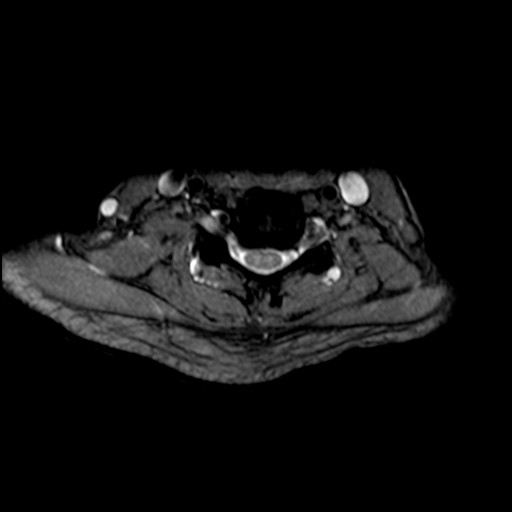

[35 of 48 positions shown; findings below may reference images not displayed]

FINDINGS: The cervical cord is normal in size and signal. Vertebral body
heights are maintained. There is degenerative disc disease at C4-5,
C5-6 and C6-7. The cervical spine is normal in lordotic alignment.
No static listhesis. Bone marrow signal is normal. Cerebellar
tonsils are normal in position.

C2-3: No significant disc bulge. No neural foraminal stenosis. No
central canal stenosis.

C3-4: Mild broad-based disc bulge. No neural foraminal stenosis. No
central canal stenosis.

C4-5: Mild broad-based disc bulge. Moderate right foraminal
stenosis. Mild left foraminal stenosis. Mild spinal stenosis.

C5-6: Mild broad-based disc bulge. Mild bilateral foraminal
stenosis. Mild central canal narrowing.

C6-7: Mild broad-based disc bulge. Mild right foraminal narrowing.
No left foraminal narrowing. Mild central canal narrowing.

C7-T1: No significant disc bulge. No neural foraminal stenosis. No
central canal stenosis.

T2-3: Small central disc protrusion. No foraminal or central canal
stenosis.
IMPRESSION: 1. At C4-5 there is a mild broad-based disc bulge. Moderate right
foraminal stenosis. Mild left foraminal stenosis. Mild spinal
stenosis.
2. At C5-6 there is a mild broad-based disc bulge. Mild bilateral
foraminal stenosis. Mild central canal narrowing.
3. At C6-7 there is a mild broad-based disc bulge. Mild right
foraminal narrowing. No left foraminal narrowing. Mild central canal
narrowing.

## 2016-08-21 ENCOUNTER — Ambulatory Visit
Admission: RE | Admit: 2016-08-21 | Discharge: 2016-08-21 | Disposition: A | Discharge status: Home / Self Care | Payer: No Typology Code available for payment source | Source: Ambulatory Visit | Attending: Infectious Diseases | Admitting: Infectious Diseases

## 2016-08-21 DIAGNOSIS — R928 Other abnormal and inconclusive findings on diagnostic imaging of breast: Secondary | ICD-10-CM | POA: Insufficient documentation

## 2016-08-21 DIAGNOSIS — N6489 Other specified disorders of breast: Secondary | ICD-10-CM

## 2016-09-30 ENCOUNTER — Other Ambulatory Visit (HOSPITAL_COMMUNITY): Payer: Self-pay | Admitting: Gastroenterology

## 2016-09-30 DIAGNOSIS — R1084 Generalized abdominal pain: Secondary | ICD-10-CM

## 2016-09-30 DIAGNOSIS — R110 Nausea: Secondary | ICD-10-CM

## 2016-10-09 ENCOUNTER — Ambulatory Visit (HOSPITAL_COMMUNITY): Payer: No Typology Code available for payment source

## 2016-10-09 ENCOUNTER — Encounter (HOSPITAL_COMMUNITY): Payer: Self-pay

## 2017-02-03 ENCOUNTER — Other Ambulatory Visit: Payer: Self-pay | Admitting: Student

## 2017-02-03 DIAGNOSIS — R630 Anorexia: Secondary | ICD-10-CM

## 2017-02-03 DIAGNOSIS — R112 Nausea with vomiting, unspecified: Secondary | ICD-10-CM

## 2017-02-03 DIAGNOSIS — R6881 Early satiety: Secondary | ICD-10-CM

## 2017-07-07 ENCOUNTER — Other Ambulatory Visit: Payer: Self-pay | Admitting: Obstetrics and Gynecology

## 2017-07-08 ENCOUNTER — Telehealth: Payer: Self-pay

## 2017-07-08 MED ORDER — NORGESTIM-ETH ESTRAD TRIPHASIC 0.18/0.215/0.25 MG-35 MCG PO TABS: 1.0000 | ORAL_TABLET | Freq: Every day | ORAL | 1 refills | Status: DC

## 2017-07-08 NOTE — Telephone Encounter (Signed)
Pt sched AEX for 08/06/17 and would like bc to [facility] ref[facility] to [facility] [address] in [place].  [phone].  Left detailed msg for pt the [facility] are [facility] [address].  To please call c her preference of one of them or [facility] [address]

## 2017-07-08 NOTE — Telephone Encounter (Signed)
[facility] [address].  Is aware rx will be sent there.

## 2017-08-06 ENCOUNTER — Encounter: Payer: Self-pay | Admitting: Obstetrics and Gynecology

## 2017-08-06 ENCOUNTER — Ambulatory Visit (INDEPENDENT_AMBULATORY_CARE_PROVIDER_SITE_OTHER): Payer: No Typology Code available for payment source | Admitting: Obstetrics and Gynecology

## 2017-08-06 VITALS — BP 110/70 | HR 79 | Ht 60.0 in | Wt 127.0 lb

## 2017-08-06 DIAGNOSIS — Z1239 Encounter for other screening for malignant neoplasm of breast: Secondary | ICD-10-CM

## 2017-08-06 DIAGNOSIS — Z01419 Encounter for gynecological examination (general) (routine) without abnormal findings: Secondary | ICD-10-CM | POA: Diagnosis not present

## 2017-08-06 DIAGNOSIS — Z1231 Encounter for screening mammogram for malignant neoplasm of breast: Secondary | ICD-10-CM

## 2017-08-06 DIAGNOSIS — Z800 Family history of malignant neoplasm of digestive organs: Secondary | ICD-10-CM

## 2017-08-06 DIAGNOSIS — Z3041 Encounter for surveillance of contraceptive pills: Secondary | ICD-10-CM

## 2017-08-06 MED ORDER — NORGESTIM-ETH ESTRAD TRIPHASIC 0.18/0.215/0.25 MG-35 MCG PO TABS: 1.0000 | ORAL_TABLET | Freq: Every day | ORAL | 4 refills | Status: DC

## 2017-08-06 NOTE — Patient Instructions (Addendum)
I value your feedback and entrusting us with your care. If you get a [facility] patient survey, I would appreciate you taking the time to let us know about your experience today. Thank you!  [facility]: [phone]    

## 2017-08-06 NOTE — Progress Notes (Signed)
PCP:  Gayla Doss, MD   Chief Complaint  Patient presents with  . Gynecologic Exam     HPI:      Ms. Gabrielle Alexander is a 47 y.o. No obstetric history on file. who LMP was Patient's last menstrual period was 08/06/2017., presents today for her annual examination.  Her menses are regular every 28-30 days, lasting 3 days.  Dysmenorrhea none. She does not have intermenstrual bleeding.  Sex activity: single partner, contraception - OCP (estrogen/progesterone). She would like to do them continuous dosing. Last Pap: June 24, 2016  Results were: no abnormalities /neg HPV DNA  Hx of STDs: none  Last mammogram: August 21, 2016  Results were: normal--routine follow-up in 12 months There is a FH of breast cancer in her mom, genetic testing not indicated. There is no FH of ovarian cancer. The patient does do self-breast exams. There is a strong FH colon cancer in her MGM and mat aunt, and pt is MyRisk neg 2016. She was getting colonoscopies Q5 yrs, but was told at last one (about 3 yrs ago), that she was good for 10 yrs.   Tobacco use: The patient denies current or previous tobacco use. Alcohol use: social drinker No drug use.  Exercise: moderately active  She does get adequate calcium and Vitamin D in her diet.   Past Medical History:  Diagnosis Date  . Acid reflux   . Anxiety   . Arthritis   . Chronic back pain greater than 3 months duration   . Heart palpitations   . Migraines   . Scoliosis   . Tachycardia     Past Surgical History:  Procedure Laterality Date  . BACK SURGERY    . CHOLECYSTECTOMY      Family History  Problem Relation Age of Onset  . Breast cancer Mother 42  . Colon cancer Maternal Grandmother 20  . Colon cancer Maternal Aunt 97    Social History   Socioeconomic History  . Marital status: Married    Spouse name: Not on file  . Number of children: Not on file  . Years of education: Not on file  . Highest education level: Not on file    Social Needs  . Financial resource strain: Not on file  . Food insecurity - worry: Not on file  . Food insecurity - inability: Not on file  . Transportation needs - medical: Not on file  . Transportation needs - non-medical: Not on file  Occupational History  . Not on file  Tobacco Use  . Smoking status: Former Games developer  . Smokeless tobacco: Never Used  Substance and Sexual Activity  . Alcohol use: No    Frequency: Never    Comment: rare  . Drug use: No  . Sexual activity: Yes    Birth control/protection: Pill  Other Topics Concern  . Not on file  Social History Narrative  . Not on file    Current Meds  Medication Sig  . Norgestimate-Ethinyl Estradiol Triphasic (TRI-SPRINTEC) 0.18/0.215/0.25 MG-35 MCG tablet Take 1 tablet by mouth daily. CONTINUOUS DOSING  . [DISCONTINUED] Norgestimate-Ethinyl Estradiol Triphasic (TRI-SPRINTEC) 0.18/0.215/0.25 MG-35 MCG tablet Take 1 tablet by mouth daily.     ROS:  Review of Systems  Constitutional: Negative for fatigue, fever and unexpected weight change.  Respiratory: Negative for cough, shortness of breath and wheezing.   Cardiovascular: Negative for chest pain, palpitations and leg swelling.  Gastrointestinal: Negative for blood in stool, constipation, diarrhea, nausea and vomiting.  Endocrine: Negative  for cold intolerance, heat intolerance and polyuria.  Genitourinary: Negative for dyspareunia, dysuria, flank pain, frequency, genital sores, hematuria, menstrual problem, pelvic pain, urgency, vaginal bleeding, vaginal discharge and vaginal pain.  Musculoskeletal: Negative for back pain, joint swelling and myalgias.  Skin: Negative for rash.  Neurological: Negative for dizziness, syncope, light-headedness, numbness and headaches.  Hematological: Negative for adenopathy.  Psychiatric/Behavioral: Negative for agitation, confusion, sleep disturbance and suicidal ideas. The patient is not nervous/anxious.      Objective: BP 110/70    Pulse 79   Ht 5' (1.524 m)   Wt 127 lb (57.6 kg)   LMP 08/06/2017   BMI 24.80 kg/m    Physical Exam  Constitutional: She is oriented to person, place, and time. She appears well-developed and well-nourished.  Genitourinary: Vagina normal and uterus normal. There is no rash or tenderness on the right labia. There is no rash or tenderness on the left labia. No erythema or tenderness in the vagina. No vaginal discharge found. Right adnexum does not display mass and does not display tenderness. Left adnexum does not display mass and does not display tenderness. Cervix does not exhibit motion tenderness or polyp. Uterus is not enlarged or tender.  Neck: Normal range of motion. No thyromegaly present.  Cardiovascular: Normal rate, regular rhythm and normal heart sounds.  No murmur heard. Pulmonary/Chest: Effort normal and breath sounds normal. Right breast exhibits no mass, no nipple discharge, no skin change and no tenderness. Left breast exhibits no mass, no nipple discharge, no skin change and no tenderness.  Abdominal: Soft. There is no tenderness. There is no guarding.  Musculoskeletal: Normal range of motion.  Neurological: She is alert and oriented to person, place, and time. No cranial nerve deficit.  Psychiatric: She has a normal mood and affect. Her behavior is normal.  Vitals reviewed.   Assessment/Plan: Encounter for annual routine gynecological examination  Screening for breast cancer - Pt to sched mammo. - Plan: MM DIGITAL SCREENING BILATERAL  Encounter for surveillance of contraceptive pills - OCP RF. Cont dosing per pt request. - Plan: Norgestimate-Ethinyl Estradiol Triphasic (TRI-SPRINTEC) 0.18/0.215/0.25 MG-35 MCG tablet  Family history of colon cancer - Pt is MyRisk neg. Cont colonoscopies with GI.  Meds ordered this encounter  Medications  . Norgestimate-Ethinyl Estradiol Triphasic (TRI-SPRINTEC) 0.18/0.215/0.25 MG-35 MCG tablet    Sig: Take 1 tablet by mouth daily.  CONTINUOUS DOSING    Dispense:  3 Package    Refill:  4             GYN counsel breast self exam, mammography screening, adequate intake of calcium and vitamin D, diet and exercise     F/U  Return in about 1 year (around 08/06/2018).  [place] Marchiano B. Adelisa Satterwhite, PA-C 08/06/2017 3:12 PM

## 2018-09-12 ENCOUNTER — Other Ambulatory Visit: Payer: Self-pay | Admitting: Obstetrics and Gynecology

## 2018-09-12 DIAGNOSIS — Z3041 Encounter for surveillance of contraceptive pills: Secondary | ICD-10-CM

## 2020-11-26 ENCOUNTER — Ambulatory Visit (LOCAL_COMMUNITY_HEALTH_CENTER): Payer: Self-pay

## 2020-11-26 ENCOUNTER — Other Ambulatory Visit: Payer: Self-pay

## 2020-11-26 DIAGNOSIS — Z111 Encounter for screening for respiratory tuberculosis: Secondary | ICD-10-CM

## 2020-11-29 ENCOUNTER — Ambulatory Visit (LOCAL_COMMUNITY_HEALTH_CENTER): Payer: Medicaid Other

## 2020-11-29 ENCOUNTER — Other Ambulatory Visit: Payer: Self-pay

## 2020-11-29 DIAGNOSIS — Z111 Encounter for screening for respiratory tuberculosis: Secondary | ICD-10-CM

## 2020-11-29 LAB — TB SKIN TEST
Induration: 0 mm
TB Skin Test: NEGATIVE

## 2021-01-05 ENCOUNTER — Emergency Department (HOSPITAL_COMMUNITY)
Admission: EM | Admit: 2021-01-05 | Discharge: 2021-01-06 | Disposition: A | Discharge status: Home / Self Care | Payer: No Typology Code available for payment source | Attending: Emergency Medicine | Admitting: Emergency Medicine

## 2021-01-05 ENCOUNTER — Emergency Department (HOSPITAL_COMMUNITY): Payer: No Typology Code available for payment source

## 2021-01-05 ENCOUNTER — Encounter (HOSPITAL_COMMUNITY): Payer: Self-pay | Admitting: Emergency Medicine

## 2021-01-05 DIAGNOSIS — E876 Hypokalemia: Secondary | ICD-10-CM | POA: Insufficient documentation

## 2021-01-05 DIAGNOSIS — R110 Nausea: Secondary | ICD-10-CM | POA: Insufficient documentation

## 2021-01-05 DIAGNOSIS — R21 Rash and other nonspecific skin eruption: Secondary | ICD-10-CM

## 2021-01-05 DIAGNOSIS — R059 Cough, unspecified: Secondary | ICD-10-CM | POA: Insufficient documentation

## 2021-01-05 DIAGNOSIS — Z87891 Personal history of nicotine dependence: Secondary | ICD-10-CM | POA: Insufficient documentation

## 2021-01-05 DIAGNOSIS — R519 Headache, unspecified: Secondary | ICD-10-CM | POA: Insufficient documentation

## 2021-01-05 LAB — CBC WITH DIFFERENTIAL/PLATELET
Abs Immature Granulocytes: 0.01 10*3/uL (ref 0.00–0.07)
Basophils Absolute: 0 10*3/uL (ref 0.0–0.1)
Basophils Relative: 1 %
Eosinophils Absolute: 0.1 10*3/uL (ref 0.0–0.5)
Eosinophils Relative: 1 %
HCT: 36.4 % (ref 36.0–46.0)
Hemoglobin: 12.2 g/dL (ref 12.0–15.0)
Immature Granulocytes: 0 %
Lymphocytes Relative: 31 %
Lymphs Abs: 1.3 10*3/uL (ref 0.7–4.0)
MCH: 29.5 pg (ref 26.0–34.0)
MCHC: 33.5 g/dL (ref 30.0–36.0)
MCV: 88.1 fL (ref 80.0–100.0)
Monocytes Absolute: 0.6 10*3/uL (ref 0.1–1.0)
Monocytes Relative: 14 %
Neutro Abs: 2.3 10*3/uL (ref 1.7–7.7)
Neutrophils Relative %: 53 %
Platelets: 172 10*3/uL (ref 150–400)
RBC: 4.13 MIL/uL (ref 3.87–5.11)
RDW: 12.3 % (ref 11.5–15.5)
WBC: 4.3 10*3/uL (ref 4.0–10.5)
nRBC: 0 % (ref 0.0–0.2)

## 2021-01-05 LAB — COMPREHENSIVE METABOLIC PANEL
ALT: 19 U/L (ref 0–44)
AST: 24 U/L (ref 15–41)
Albumin: 2.9 g/dL — ABNORMAL LOW (ref 3.5–5.0)
Alkaline Phosphatase: 45 U/L (ref 38–126)
Anion gap: 8 (ref 5–15)
BUN: 7 mg/dL (ref 6–20)
CO2: 25 mmol/L (ref 22–32)
Calcium: 8 mg/dL — ABNORMAL LOW (ref 8.9–10.3)
Chloride: 106 mmol/L (ref 98–111)
Creatinine, Ser: 0.65 mg/dL (ref 0.44–1.00)
GFR, Estimated: >60 mL/min (ref >60)
Glucose, Bld: 106 mg/dL — ABNORMAL HIGH (ref 70–99)
Potassium: 2.9 mmol/L — ABNORMAL LOW (ref 3.5–5.1)
Sodium: 139 mmol/L (ref 135–145)
Total Bilirubin: 0.4 mg/dL (ref 0.3–1.2)
Total Protein: 6 g/dL — ABNORMAL LOW (ref 6.5–8.1)

## 2021-01-05 LAB — TROPONIN I (HIGH SENSITIVITY)
Troponin I (High Sensitivity): 3 ng/L (ref <18)
Troponin I (High Sensitivity): 2 ng/L (ref <18)

## 2021-01-05 LAB — I-STAT BETA HCG BLOOD, ED (MC, WL, AP ONLY): I-stat hCG, quantitative: <5 m[IU]/mL (ref <5)

## 2021-01-05 NOTE — ED Provider Notes (Signed)
Emergency Medicine Provider Triage Evaluation Note  Gabrielle Alexander , a 50 y.o. female  was evaluated in triage.  Pt complains of cp and rash.  Chest pain began earlier today.  Does not radiate.  Has history of tachycardia.  She is also noted a rash to her bilateral hands.  No history of IV drug use.  States you are using a clear liquid.  No known sick contacts.  No intraoral lesions.  No lateral leg swelling, redness or warmth.  Review of Systems  Positive: Chest pain, rash Negative: Fever, chills, nausea or vomiting  Physical Exam  BP 137/88 (BP Location: Right Arm)   Pulse 76   Temp 98.7 F (37.1 C) (Oral)   Resp 18   Ht 4' 11.75" (1.518 m)   Wt 59 kg   SpO2 94%   BMI 25.60 kg/m  Gen:   Awake, no distress   Resp:  Normal effort  MSK:   Moves extremities without difficulty  SKIN:  Ulcerative lesion to bilateral hands on lateral aspect. Other:    Medical Decision Making  Medically screening exam initiated at 6:31 PM.  Appropriate orders placed.  Jaydah Stahle was informed that the remainder of the evaluation will be completed by another provider, this initial triage assessment does not replace that evaluation, and the importance of remaining in the ED until their evaluation is complete.  CP, Rash   Ayaz Sondgeroth A, PA-C 01/05/21 Michelle Nasuti, MD 01/05/21 [phone]

## 2021-01-05 NOTE — ED Triage Notes (Signed)
Pt endorses feeling sick for 4-5 days, including HA, cough and nausea. Noticed sores on her hands yesterday.

## 2021-01-06 LAB — RPR: RPR Ser Ql: NONREACTIVE

## 2021-01-06 MED ORDER — DOXYCYCLINE HYCLATE 100 MG PO CAPS: 100.0000 mg | ORAL_CAPSULE | Freq: Two times a day (BID) | ORAL | 0 refills | Status: AC

## 2021-01-06 MED ORDER — POTASSIUM CHLORIDE CRYS ER 20 MEQ PO TBCR: 40.0000 meq | EXTENDED_RELEASE_TABLET | Freq: Every day | ORAL | 0 refills | Status: DC

## 2021-01-06 MED ORDER — DOXYCYCLINE HYCLATE 100 MG PO TABS
100.0000 mg | ORAL_TABLET | Freq: Once | ORAL | Status: AC
  Administered 2021-01-06: 100 mg via ORAL
  Filled 2021-01-06: qty 1

## 2021-01-06 NOTE — Discharge Instructions (Addendum)
You were seen in the emerge from today with a rash to your hands.  I am starting an antibiotic but I would like you to follow with a dermatologist.  I list the name of the practice here locally but feel free to choose any dermatology practice that you wish.  Please complete the entire course of antibiotics.  Return to the emergency department any new or suddenly worsening symptoms.  Please take the potassium supplements for the next several days as well. Have your PCP repeat blood work in the next week.

## 2021-01-06 NOTE — ED Provider Notes (Signed)
Emergency Department Provider Note   I have reviewed the triage vital signs and the nursing notes.   HISTORY  Chief Complaint No chief complaint on file.   HPI Gabrielle Alexander is a 50 y.o. female  presents to the ED with HA, cough, and nausea. She has also notes a rash developing to the hands for the last several days. No fever or chills. Notes some itchy rash to palms and arms for the last several months diagnosed as eczema. She notes that this is a chronic problem and has seen a Dermatology in the past but that the new rash is different. Notes that rash beging small and then develops larges with some clear drainage at times. Some streaking noticed today up the right arm.   Past Medical History:  Diagnosis Date   Acid reflux    Anxiety    Arthritis    Chronic back pain greater than 3 months duration    Heart palpitations    Migraines    Scoliosis    Tachycardia     There are no problems to display for this patient.   Past Surgical History:  Procedure Laterality Date   BACK SURGERY     CHOLECYSTECTOMY      Allergies Patient has no known allergies.  Family History  Problem Relation Age of Onset   Breast cancer Mother 19   Colon cancer Maternal Grandmother 20   Colon cancer Maternal Aunt 10    Social History Social History   Tobacco Use   Smoking status: Former    Pack years: 0.00   Smokeless tobacco: Never  Vaping Use   Vaping Use: Never used  Substance Use Topics   Alcohol use: No    Comment: rare   Drug use: No    Review of Systems  Constitutional: No fever/chills Eyes: No visual changes. ENT: No sore throat. Cardiovascular: Denies chest pain. Respiratory: Denies shortness of breath. Gastrointestinal: No abdominal pain. Positive nausea, no vomiting.  No diarrhea.  No constipation. Genitourinary: Negative for dysuria. Musculoskeletal: Negative for back pain. Skin: Positive rash.  Neurological: Negative for focal weakness or numbness. Positive  HA.   10-point ROS otherwise negative.  ____________________________________________   PHYSICAL EXAM:  VITAL SIGNS: ED Triage Vitals  Enc Vitals Group     BP 01/05/21 1809 137/88     Pulse Rate 01/05/21 1809 76     Resp 01/05/21 1809 18     Temp 01/05/21 1809 98.7 F (37.1 C)     Temp Source 01/05/21 1809 Oral     SpO2 01/05/21 1809 94 %     Weight 01/05/21 1809 130 lb (59 kg)     Height 01/05/21 1809 4' 11.75" (1.518 m)   Constitutional: Alert and oriented. Well appearing and in no acute distress. Eyes: Conjunctivae are normal. Head: Atraumatic. Nose: No congestion/rhinnorhea. Mouth/Throat: Mucous membranes are moist.  No mucosal lesions.  Neck: No stridor.  Cardiovascular: Normal rate, regular rhythm. Good peripheral circulation. Grossly normal heart sounds.   Respiratory: Normal respiratory effort.  No retractions. Lungs CTAB. Gastrointestinal: Soft and nontender. No distention.  Musculoskeletal: No lower extremity tenderness nor edema. No gross deformities of extremities. Neurologic:  Normal speech and language. No gross focal neurologic deficits are appreciated.  Skin:  Skin is warm, and dry. Eczema type plagues over palms and forearms bilaterally and mild. Well demarcated lesions noted to the bilateral hands with the largest measuring 2 cm with some overlying crusting. No fluctuance. No petechiae. Faint streaking along  the medial right forearm.    ____________________________________________   LABS (all labs ordered are listed, but only abnormal results are displayed)  Labs Reviewed  COMPREHENSIVE METABOLIC PANEL - Abnormal; Notable for the following components:      Result Value   Potassium 2.9 (*)    Glucose, Bld 106 (*)    Calcium 8.0 (*)    Total Protein 6.0 (*)    Albumin 2.9 (*)    All other components within normal limits  CBC WITH DIFFERENTIAL/PLATELET  RPR  LYME DISEASE DNA BY PCR([facility]IA [facility])  ROCKY MTN SPOTTED FVR ABS PNL(IGG+IGM)  I-STAT BETA  HCG BLOOD, ED ([facility], [facility], [facility] ONLY)  TROPONIN I (HIGH SENSITIVITY)  TROPONIN I (HIGH SENSITIVITY)   ____________________________________________  RADIOLOGY  DG Chest 2 View  Result Date: 01/05/2021 CLINICAL DATA:  Chest pain.  Shortness of breath.  Cough for months. EXAM: CHEST - 2 VIEW COMPARISON:  May 21, 2016 FINDINGS: The heart size and mediastinal contours are within normal limits. Both lungs are clear. The visualized skeletal structures are unremarkable. IMPRESSION: No active cardiopulmonary disease. Electronically Signed   By: Gerome Sam III M.D   On: 01/05/2021 18:54    ____________________________________________   PROCEDURES  Procedure(s) performed:   Procedures  None ____________________________________________   INITIAL IMPRESSION / ASSESSMENT AND PLAN / ED COURSE  Pertinent labs & imaging results that were available during my care of the patient were reviewed by me and considered in my medical decision making (see chart for details).   Patient presents with rash the bilateral hands. No history or yard work/thorn prick, tick bite, fever/chills. Rash not consistent with meningeal rash. No petechiae. Monkeypox considered but no fever and rash seems less characteristic. ? Discoid eczema with question of developing some secondary bacterial component. Will cover with Doxy but will want close Dermatology follow up. Gave area dermatology contact information at discharge along with close ED return precautions.    ____________________________________________  FINAL CLINICAL IMPRESSION(S) / ED DIAGNOSES  Final diagnoses:  Rash  Hypokalemia     MEDICATIONS GIVEN DURING THIS VISIT:  Medications  doxycycline (VIBRA-TABS) tablet 100 mg (100 mg Oral Given 01/06/21 0508)     NEW OUTPATIENT MEDICATIONS STARTED DURING THIS VISIT:  Discharge Medication List as of 01/06/2021  5:13 AM     START taking these medications   Details  doxycycline (VIBRAMYCIN) 100 MG  capsule Take 1 capsule (100 mg total) by mouth 2 (two) times daily for 10 days., Starting Sun 01/06/2021, Until Wed 01/16/2021, Normal    potassium chloride SA (KLOR-CON) 20 MEQ tablet Take 2 tablets (40 mEq total) by mouth daily for 5 days., Starting Sun 01/06/2021, Until Fri 01/11/2021, Normal        Note:  This document was prepared using Dragon voice recognition software and may include unintentional dictation errors.  Alona Bene, MD, [facility]    Reeanna Acri, Arlyss Repress, MD 01/07/21 1719

## 2021-01-09 LAB — ROCKY MTN SPOTTED FVR ABS PNL(IGG+IGM)
RMSF IgG: NEGATIVE
RMSF IgM: 0.2 index (ref 0.00–0.89)

## 2021-01-11 LAB — LYME DISEASE DNA BY PCR(BORRELIA BURG): Lyme Disease(B.burgdorferi)PCR: NEGATIVE

## 2022-05-27 ENCOUNTER — Other Ambulatory Visit: Payer: Self-pay | Admitting: Infectious Diseases

## 2022-05-27 DIAGNOSIS — Z1231 Encounter for screening mammogram for malignant neoplasm of breast: Secondary | ICD-10-CM

## 2022-08-05 ENCOUNTER — Ambulatory Visit
Admission: RE | Admit: 2022-08-05 | Discharge: 2022-08-05 | Disposition: A | Discharge status: Home / Self Care | Payer: BC Managed Care – PPO | Source: Ambulatory Visit | Attending: Infectious Diseases | Admitting: Infectious Diseases

## 2022-08-05 ENCOUNTER — Other Ambulatory Visit: Payer: Self-pay | Admitting: Cardiovascular Disease

## 2022-08-05 DIAGNOSIS — Z1231 Encounter for screening mammogram for malignant neoplasm of breast: Secondary | ICD-10-CM | POA: Diagnosis present

## 2023-11-26 NOTE — Progress Notes (Unsigned)
 Gabrielle Gold, MD   No chief complaint on file.   HPI:      Ms. Gabrielle Alexander is a 53 y.o. G1P0101 whose LMP was No LMP recorded. Patient is perimenopausal., presents today for NP> 3 yrs HRT Seen at [facility]; had perimenopausal bleeidng with EM=8.55 mm on GYN u/s; had EMB? Neg pap/neg HPV DNA 08/05/22;  Last mammo 08/05/22  There are no active problems to display for this patient.   Past Surgical History:  Procedure Laterality Date   BACK SURGERY     CHOLECYSTECTOMY      Family History  Problem Relation Age of Onset   Breast cancer Mother 52   Colon cancer Maternal Grandmother 20   Colon cancer Maternal Aunt 32    Social History   Socioeconomic History   Marital status: Married    Spouse name: Not on file   Number of children: Not on file   Years of education: Not on file   Highest education level: Not on file  Occupational History   Not on file  Tobacco Use   Smoking status: Former   Smokeless tobacco: Never  Vaping Use   Vaping status: Never Used  Substance and Sexual Activity   Alcohol use: No    Comment: rare   Drug use: No   Sexual activity: Yes    Birth control/protection: Pill  Other Topics Concern   Not on file  Social History Narrative   Not on file   Social Drivers of Health   Financial Resource Strain: Low Risk  (01/09/2023)   Received from [facility], [facility]   Overall Financial Resource Strain (CARDIA)    Difficulty of Paying Living Expenses: Not very hard  Food Insecurity: Food Insecurity Present (01/09/2023)   Received from [facility], [facility]   Hunger Vital Sign    Worried About Running Out of Food in the Last Year: Sometimes true    Ran Out of Food in the Last Year: Never true  Transportation Needs: No Transportation Needs (01/09/2023)   Received from [facility], [facility]   PRAPARE - Transportation    In the  past 12 months, has lack of transportation kept you from medical appointments or from getting medications?: No    Lack of Transportation (Non-Medical): No  Physical Activity: Not on file  Stress: Not on file  Social Connections: Not on file  Intimate Partner Violence: Not on file    Outpatient Medications Prior to Visit  Medication Sig Dispense Refill   Ca Carbonate-Mag Hydroxide (ROLAIDS PO) Take 1 tablet by mouth 3 (three) times daily as needed (heartburn). (Patient not taking: Reported on 11/26/2020)     clonazePAM (KLONOPIN) 0.5 MG tablet Take 0.5 mg by mouth 3 (three) times daily as needed for anxiety.     esomeprazole  (NEXIUM ) 40 MG capsule Take 1 capsule (40 mg total) by mouth daily. (Patient not taking: Reported on 11/26/2020) 30 capsule 0   Norgestimate-Ethinyl Estradiol Triphasic (TRI-SPRINTEC) 0.18/0.215/0.25 MG-35 MCG tablet Take 1 tablet by mouth daily. CONTINUOUS DOSING 3 Package 4   ondansetron  (ZOFRAN  ODT) 4 MG disintegrating tablet Allow 1-2 tablets to dissolve in your mouth every 8 hours as needed for nausea/vomiting 30 tablet 0   potassium chloride  SA (KLOR-CON ) 20 MEQ tablet Take 2 tablets (40 mEq total) by mouth daily for 5 days. 10 tablet 0   ranitidine (ZANTAC) 150 MG tablet Take 150  mg by mouth 2 (two) times daily as needed for heartburn. (Patient not taking: Reported on 11/26/2020)     sucralfate  (CARAFATE ) 1 GM/10ML suspension Take 10 mLs (1 g total) by mouth 4 (four) times daily -  with meals and at bedtime. (Patient not taking: Reported on 11/26/2020) 420 mL 0   No facility-administered medications prior to visit.      ROS:  Review of Systems BREAST: No symptoms   OBJECTIVE:   Vitals:  There were no vitals taken for this visit.  Physical Exam  Results: No results found for this or any previous visit (from the past 24 hours).   Assessment/Plan: No diagnosis found.    No orders of the defined types were placed in this encounter.     No follow-ups on  file.  Jessel Gettinger B. Fabrizio Filip, PA-C 11/26/2023 4:01 PM

## 2023-12-01 ENCOUNTER — Ambulatory Visit (INDEPENDENT_AMBULATORY_CARE_PROVIDER_SITE_OTHER): Admitting: Obstetrics and Gynecology

## 2023-12-01 ENCOUNTER — Encounter: Payer: Self-pay | Admitting: Obstetrics and Gynecology

## 2023-12-01 VITALS — BP 120/80 | HR 72 | Ht 59.5 in | Wt 152.0 lb

## 2023-12-01 DIAGNOSIS — R399 Unspecified symptoms and signs involving the genitourinary system: Secondary | ICD-10-CM | POA: Diagnosis not present

## 2023-12-01 DIAGNOSIS — R319 Hematuria, unspecified: Secondary | ICD-10-CM | POA: Diagnosis not present

## 2023-12-01 DIAGNOSIS — N939 Abnormal uterine and vaginal bleeding, unspecified: Secondary | ICD-10-CM | POA: Diagnosis not present

## 2023-12-01 DIAGNOSIS — Z780 Asymptomatic menopausal state: Secondary | ICD-10-CM | POA: Diagnosis not present

## 2023-12-01 DIAGNOSIS — Z79890 Hormone replacement therapy (postmenopausal): Secondary | ICD-10-CM

## 2023-12-01 DIAGNOSIS — R310 Gross hematuria: Secondary | ICD-10-CM

## 2023-12-01 DIAGNOSIS — Z1231 Encounter for screening mammogram for malignant neoplasm of breast: Secondary | ICD-10-CM

## 2023-12-01 LAB — POCT URINALYSIS DIPSTICK
Bilirubin, UA: NEGATIVE
Glucose, UA: NEGATIVE
Ketones, UA: NEGATIVE
Nitrite, UA: NEGATIVE
Protein, UA: NEGATIVE
Spec Grav, UA: 1.02 (ref 1.010–1.025)
pH, UA: 6 (ref 5.0–8.0)

## 2023-12-01 NOTE — Patient Instructions (Signed)
 I value your feedback and you entrusting [place] with your care. If you get a [facility] patient survey, I would appreciate you taking the time to let us know about your experience today. Thank you!  [facility] ([place])--[phone]

## 2023-12-03 ENCOUNTER — Encounter: Payer: Self-pay | Admitting: Obstetrics and Gynecology

## 2023-12-03 LAB — URINE CULTURE: Organism ID, Bacteria: NO GROWTH

## 2023-12-03 NOTE — Addendum Note (Signed)
 Addended by: [name] B on: 12/03/2023 08:14 AM   Modules accepted: Orders

## 2023-12-08 ENCOUNTER — Other Ambulatory Visit

## 2023-12-08 DIAGNOSIS — N939 Abnormal uterine and vaginal bleeding, unspecified: Secondary | ICD-10-CM

## 2023-12-08 DIAGNOSIS — Z780 Asymptomatic menopausal state: Secondary | ICD-10-CM

## 2023-12-09 LAB — FOLLICLE STIMULATING HORMONE: FSH: 90.6 m[IU]/mL

## 2023-12-09 LAB — ESTRADIOL: Estradiol: 12.1 pg/mL

## 2023-12-10 ENCOUNTER — Ambulatory Visit: Payer: Self-pay | Admitting: Obstetrics and Gynecology

## 2023-12-10 NOTE — Progress Notes (Signed)
 FYI

## 2023-12-14 ENCOUNTER — Other Ambulatory Visit: Payer: Self-pay

## 2023-12-14 DIAGNOSIS — R310 Gross hematuria: Secondary | ICD-10-CM

## 2023-12-15 ENCOUNTER — Ambulatory Visit (INDEPENDENT_AMBULATORY_CARE_PROVIDER_SITE_OTHER): Admitting: Urology

## 2023-12-15 ENCOUNTER — Encounter: Payer: Self-pay | Admitting: Urology

## 2023-12-15 ENCOUNTER — Other Ambulatory Visit
Admission: RE | Admit: 2023-12-15 | Discharge: 2023-12-15 | Disposition: A | Discharge status: Home / Self Care | Attending: Urology | Admitting: Urology

## 2023-12-15 VITALS — BP 108/72 | HR 66 | Ht 59.5 in | Wt 153.2 lb

## 2023-12-15 DIAGNOSIS — N958 Other specified menopausal and perimenopausal disorders: Secondary | ICD-10-CM | POA: Diagnosis not present

## 2023-12-15 DIAGNOSIS — R310 Gross hematuria: Secondary | ICD-10-CM

## 2023-12-15 LAB — URINALYSIS, COMPLETE (UACMP) WITH MICROSCOPIC
Bilirubin Urine: NEGATIVE
Glucose, UA: NEGATIVE mg/dL
Ketones, ur: NEGATIVE mg/dL
Nitrite: NEGATIVE
Protein, ur: NEGATIVE mg/dL
Specific Gravity, Urine: 1.02 (ref 1.005–1.030)
pH: 5.5 (ref 5.0–8.0)

## 2023-12-15 MED ORDER — ESTRADIOL 0.1 MG/GM VA CREA: TOPICAL_CREAM | VAGINAL | 12 refills | Status: AC

## 2023-12-15 NOTE — Patient Instructions (Signed)

## 2023-12-15 NOTE — Progress Notes (Signed)
   12/15/23 10:29 AM   Gabrielle Alexander 29-May-1971 [id number]  CC: Gross hematuria, recurrent UTI  HPI: 53 year old female who reports intermittent gross hematuria when wiping for at least 6 to 12 months, she also reports 2-3 UTIs per year, and 6 months of discomfort with urination.  UTI symptoms are typically dysuria, and "her knuckles hurt ."These have all been treated through [facility], so no culture results are available.  She also has dyspareunia.  Urine culture 12/01/2023 no growth.  Urinalysis today 11-20 squamous cells, 6-10 WBC, 11-20 RBC, few bacteria, yeast present, trace leukocytes, nitrite negative.    PMH: Past Medical History:  Diagnosis Date   Acid reflux    Anxiety    Arthritis    Chronic back pain greater than 3 months duration    Heart palpitations    Migraines    Scoliosis    Tachycardia     Surgical History: Past Surgical History:  Procedure Laterality Date   BACK SURGERY     CHOLECYSTECTOMY       Family History: Family History  Problem Relation Age of Onset   Breast cancer Mother 61   Colon cancer Maternal Aunt 60   Colon cancer Maternal Grandmother 20    Social History:  reports that she has quit smoking. Her smoking use included cigarettes. She has never used smokeless tobacco. She reports current alcohol use. She reports that she does not use drugs.  Physical Exam: BP 108/72 (BP Location: Left Arm, Patient Position: Sitting, Cuff Size: Normal)   Pulse 66   Ht 4' 11.5" (1.511 m)   Wt 153 lb 3.2 oz (69.5 kg)   SpO2 96%   BMI 30.42 kg/m    Constitutional:  Alert and oriented, No acute distress. Cardiovascular: No clubbing, cyanosis, or edema. Respiratory: Normal respiratory effort, no increased work of breathing. GI: Abdomen is soft, nontender, nondistended, no abdominal masses  Laboratory Data: Reviewed, see HPI  Pertinent Imaging: No recent cross-sectional imaging to review  Assessment & Plan:   53 year old female with reported gross  hematuria or blood with wiping over at least the last 6 months, reported recurrent UTIs though cultures not available as these were treated through telehealth, discomfort with urination.  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  We discussed common possible etiologies of hematuria including GSM, malignancy, urolithiasis, medical renal disease, and idiopathic. Standard workup recommended by the AUA includes imaging with CT urogram to assess the upper tracts, and cystoscopy. Cytology is performed on patient's with gross hematuria to look for malignant cells in the urine.  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

## 2023-12-23 NOTE — Progress Notes (Unsigned)
    GYNECOLOGY PROGRESS NOTE  Subjective:  PCP: Gabrielle Gold, MD  Patient ID: Gabrielle Alexander, female    DOB: Dec 06, 1970, 53 y.o.   MRN: [id number]  HPI  Patient is a 53 y.o. G50P0101 female who presents for PMB off and on for the past year. Was seen by Gabrielle Alexander on 12/01/23 and she noted a possible polyp in the cervical os she wanted a closer look at.  Pt menopausal in 2022, LMP was in 2022 had some spotting in 2023. Usually occurs after intercourse, but sometimes randomly as well.  Gabrielle Alexander obtained labs 12/08/23: FSH = 90.6 E2 = 12.1  Was evaluated at [facility] 07/2022: Pelvic US  with 2 fibroids (1 lt ant= 1.58 cm, 2 lt lat= 1.72 cm). EMT was 8.79mm, EMB pathology = "negative, weakly proliferative" and pap was normal, HPV negative. She does have hx of LEEP in the past.   The following portions of the patient's history were reviewed and updated as appropriate: allergies, current medications, past family history, past medical history, past social history, past surgical history, and problem list.  Review of Systems Pertinent items are noted in HPI.   Objective:   Blood pressure 113/60, pulse 71, weight 155 lb (70.3 kg). Body mass index is 30.78 kg/m.  Physical Exam Vitals and nursing note reviewed. Exam conducted with a chaperone present.  Constitutional:      Appearance: Normal appearance.  HENT:     Head: Normocephalic and atraumatic.  Eyes:     Extraocular Movements: Extraocular movements intact.  Pulmonary:     Effort: Pulmonary effort is normal.  Genitourinary:    General: Normal vulva.     Vagina: Normal.     Cervix: Friability present.        Comments: Used colposcope to visualize cervix, a 2mm ridge in the posterior cervix at approx 6 o'clock noted, tissue confluent with surrounding os, unable to grasp w/Kelly. Obtained an ECC. Unable to scrape down the ridge-like lesion. Neurological:     General: No focal deficit present.     Mental Status: She is alert.   Psychiatric:        Mood and Affect: Mood normal.    Assessment/Plan:   1. Postmenopausal postcoital bleeding   2. Abnormal uterine bleeding (AUB)     53 y.o. G1P0101 with intermittent PMB since 2023, post-menopausal status confirmed by consistently elevated FSH. Has 2 small (less than 2cm) fibroids on last pelvic US  07/2022 and negative EMB; nodular lesion on the posterior cervix could be contributory. ECC obtained today. Bleeding could be from atrophy/unstable endometrium, pt is not on HRT, was recently started on topical estrogen by urology. We discussed potentially needing a hysteroscopy, versus if ECC is normal, may require HRT to help stabilize any atrophic effect. Will call with results and next steps.   Total time was 35 minutes. That includes chart review before the visit, the actual patient visit, and time spent on documentation after the visit. Time excludes procedures, if any.    Gabrielle Dunn, DO [facility]

## 2023-12-24 ENCOUNTER — Encounter: Payer: Self-pay | Admitting: Obstetrics

## 2023-12-24 ENCOUNTER — Other Ambulatory Visit (HOSPITAL_COMMUNITY)
Admission: RE | Admit: 2023-12-24 | Discharge: 2023-12-24 | Disposition: A | Discharge status: Home / Self Care | Source: Ambulatory Visit | Attending: Obstetrics | Admitting: Obstetrics

## 2023-12-24 ENCOUNTER — Ambulatory Visit (INDEPENDENT_AMBULATORY_CARE_PROVIDER_SITE_OTHER): Admitting: Obstetrics

## 2023-12-24 VITALS — BP 113/60 | HR 71 | Wt 155.0 lb

## 2023-12-24 DIAGNOSIS — N939 Abnormal uterine and vaginal bleeding, unspecified: Secondary | ICD-10-CM | POA: Insufficient documentation

## 2023-12-24 DIAGNOSIS — Z124 Encounter for screening for malignant neoplasm of cervix: Secondary | ICD-10-CM | POA: Diagnosis present

## 2023-12-24 DIAGNOSIS — N930 Postcoital and contact bleeding: Secondary | ICD-10-CM | POA: Insufficient documentation

## 2023-12-24 DIAGNOSIS — N950 Postmenopausal bleeding: Secondary | ICD-10-CM

## 2023-12-28 LAB — SURGICAL PATHOLOGY

## 2023-12-30 ENCOUNTER — Ambulatory Visit: Admitting: Obstetrics

## 2023-12-30 ENCOUNTER — Ambulatory Visit: Payer: Self-pay | Admitting: Obstetrics

## 2023-12-30 LAB — CYTOLOGY - PAP
Chlamydia: NEGATIVE
Comment: NEGATIVE
Comment: NEGATIVE
Comment: NEGATIVE
Comment: NORMAL
Diagnosis: NEGATIVE
High risk HPV: NEGATIVE
Neisseria Gonorrhea: NEGATIVE
Trichomonas: NEGATIVE

## 2024-01-05 ENCOUNTER — Ambulatory Visit (INDEPENDENT_AMBULATORY_CARE_PROVIDER_SITE_OTHER): Admitting: Urology

## 2024-01-05 VITALS — BP 118/80 | HR 78 | Ht 59.5 in | Wt 154.0 lb

## 2024-01-05 DIAGNOSIS — F419 Anxiety disorder, unspecified: Secondary | ICD-10-CM | POA: Insufficient documentation

## 2024-01-05 DIAGNOSIS — Z792 Long term (current) use of antibiotics: Secondary | ICD-10-CM

## 2024-01-05 DIAGNOSIS — E559 Vitamin D deficiency, unspecified: Secondary | ICD-10-CM | POA: Insufficient documentation

## 2024-01-05 DIAGNOSIS — F32A Depression, unspecified: Secondary | ICD-10-CM | POA: Insufficient documentation

## 2024-01-05 DIAGNOSIS — M1990 Unspecified osteoarthritis, unspecified site: Secondary | ICD-10-CM | POA: Insufficient documentation

## 2024-01-05 DIAGNOSIS — G43909 Migraine, unspecified, not intractable, without status migrainosus: Secondary | ICD-10-CM | POA: Insufficient documentation

## 2024-01-05 MED ORDER — CEPHALEXIN 250 MG PO CAPS
500.0000 mg | ORAL_CAPSULE | Freq: Once | ORAL | Status: AC
  Administered 2024-01-05: 500 mg via ORAL

## 2024-01-05 NOTE — Progress Notes (Signed)
 Cystoscopy Procedure Note:  Indication: Microscopic hematuria  After informed consent and discussion of the procedure and its risks, Lynee Rosenbach was positioned and prepped in the standard fashion. Cystoscopy was performed with a flexible cystoscope. The urethra, bladder neck and entire bladder was visualized in a standard fashion. The ureteral orifices were visualized in their normal location and orientation.  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

## 2024-04-03 NOTE — Progress Notes (Deleted)
 04/04/2024 4:44 PM   Gabrielle Alexander 1971/05/09 [id number]  Referring provider: Epifanio Alm SQUIBB, MD [address] [place],  [place] [address]  Urological history: 1. High risk hematuria - former smoker - CTU ordered, but not completed - cysto (12/2023) ED  2. GSM - vaginal estrogen cream three nights weekly  No chief complaint on file.  HPI: Gabrielle Alexander is a 53 y.o. woman who presents today for follow up after after being prescribed vaginal estrogen cream for GSM.  Previous records reviewed.  She has been seen by Dr. Francisca back in May for complaints of blood on her toilet paper when she wiped for the last 6 to 12 months and also had a report of 2-3 UTIs per year and 6 months of discomfort with urination.  She had her UTI symptoms addressed through virtual visits, so no urine cultures were obtained.  She underwent cystoscopy in June with Dr. Francisca and it was normal.  A CT urogram was also ordered, but had not been completed at the time of the cystoscopy.  It still has not been completed at the time of this visit.  She was prescribed vaginal estrogen cream back in May by Dr. Francisca.          PMH: Past Medical History:  Diagnosis Date   Acid reflux    Anxiety    Arthritis    Chronic back pain greater than 3 months duration    Heart palpitations    Migraines    Scoliosis    Tachycardia     Surgical History: Past Surgical History:  Procedure Laterality Date   BACK SURGERY     CHOLECYSTECTOMY      Home Medications:  Allergies as of 04/04/2024   No Known Allergies      Medication List        Accurate as of April 03, 2024  4:44 PM. If you have any questions, ask your nurse or doctor.          clonazePAM 0.5 MG disintegrating tablet Commonly known as: KLONOPIN Take 0.5 mg by mouth 2 (two) times daily as needed.   estradiol  0.1 MG/GM vaginal cream Commonly known as: ESTRACE  Estrogen Cream Instruction Discard applicator Apply  pea sized amount to tip of finger to urethra before bed. Wash hands well after application. Use Monday, Wednesday and Friday   fluticasone 50 MCG/ACT nasal spray Commonly known as: FLONASE Place 2 sprays into the nose.   HYDROcodone-acetaminophen  5-325 MG tablet Commonly known as: NORCO/VICODIN Take 1 tablet by mouth every 6 (six) hours as needed.   metoprolol succinate 100 MG 24 hr tablet Commonly known as: TOPROL-XL Take 100 mg by mouth 2 (two) times daily.   ROLAIDS PO Take 1 tablet by mouth 3 (three) times daily as needed (heartburn).   traZODone 100 MG tablet Commonly known as: DESYREL Take 100 mg by mouth at bedtime.   VITAMIN D-3 PO Take 4,000 Units by mouth.        Allergies: No Known Allergies  Family History: Family History  Problem Relation Age of Onset   Breast cancer Mother 73   Colon cancer Maternal Aunt 54   Colon cancer Maternal Grandmother 20    Social History:  reports that she has quit smoking. Her smoking use included cigarettes. She has never used smokeless tobacco. She reports current alcohol use. She reports that she does not use drugs.  ROS: Pertinent ROS in HPI  Physical Exam: There were no vitals taken  for this visit.  Constitutional:  Well nourished. Alert and oriented, No acute distress. HEENT: [place] AT, moist mucus membranes.  Trachea midline, no masses. Cardiovascular: No clubbing, cyanosis, or edema. Respiratory: Normal respiratory effort, no increased work of breathing. GU: No CVA tenderness.  No bladder fullness or masses.  Recession of labia minora, dry, pale vulvar vaginal mucosa and loss of mucosal ridges and folds.  Normal urethral meatus, no lesions, no prolapse, no discharge.   No urethral masses, tenderness and/or tenderness. No bladder fullness, tenderness or masses. *** vagina mucosa, *** estrogen effect, no discharge, no lesions, *** pelvic support, *** cystocele and *** rectocele noted.  No cervical motion tenderness.  Uterus  is freely mobile and non-fixed.  No adnexal/parametria masses or tenderness noted.  Anus and perineum are without rashes or lesions.   ***  Neurologic: Grossly intact, no focal deficits, moving all 4 extremities. Psychiatric: Normal mood and affect.    Laboratory Data: See EPIC and HPI I have reviewed the labs.   Pertinent Imaging: ***  Assessment & Plan:  ***  1. High risk hematuria - Encourage patient to complete her CT urogram and explained to her at this time we have an incomplete evaluation of her episodes of gross hematuria and we still have not evaluated her kidneys or ureters sufficiently  2. GSM - Explained how topical vaginal estrogen cream changes the physiology of the vagina over time making it more robust and thereby relieving her symptoms of pelvic discomfort and also preventing recurrent UTIs in the future -  No follow-ups on file.  These notes generated with voice recognition software. I apologize for typographical errors.  CLOTILDA HELON RIGGERS  [facility] [address]  [address] [place], [place] [address] [phone]

## 2024-04-04 ENCOUNTER — Ambulatory Visit: Payer: Self-pay | Admitting: Urology

## 2024-04-04 DIAGNOSIS — N958 Other specified menopausal and perimenopausal disorders: Secondary | ICD-10-CM

## 2024-04-04 DIAGNOSIS — R310 Gross hematuria: Secondary | ICD-10-CM

## 2024-04-05 ENCOUNTER — Encounter: Payer: Self-pay | Admitting: Urology

## 2024-05-15 ENCOUNTER — Encounter: Payer: Self-pay | Admitting: Obstetrics and Gynecology
